# Patient Record
Sex: Female | Born: 1960 | Race: White | Hispanic: No | Smoking: Current every day smoker
Health system: Southern US, Community
[De-identification: ages and names within clinical notes are randomized; demographics above are authoritative.]

## PROBLEM LIST (undated history)

## (undated) DIAGNOSIS — N281 Cyst of kidney, acquired: Secondary | ICD-10-CM

## (undated) DIAGNOSIS — G8929 Other chronic pain: Secondary | ICD-10-CM

## (undated) DIAGNOSIS — M779 Enthesopathy, unspecified: Secondary | ICD-10-CM

## (undated) DIAGNOSIS — G589 Mononeuropathy, unspecified: Secondary | ICD-10-CM

## (undated) DIAGNOSIS — F32A Depression, unspecified: Secondary | ICD-10-CM

## (undated) DIAGNOSIS — R519 Headache, unspecified: Secondary | ICD-10-CM

## (undated) DIAGNOSIS — R7303 Prediabetes: Secondary | ICD-10-CM

## (undated) DIAGNOSIS — E611 Iron deficiency: Secondary | ICD-10-CM

## (undated) DIAGNOSIS — I639 Cerebral infarction, unspecified: Secondary | ICD-10-CM

## (undated) DIAGNOSIS — M5412 Radiculopathy, cervical region: Secondary | ICD-10-CM

## (undated) DIAGNOSIS — M549 Dorsalgia, unspecified: Secondary | ICD-10-CM

## (undated) DIAGNOSIS — M5020 Other cervical disc displacement, unspecified cervical region: Secondary | ICD-10-CM

## (undated) DIAGNOSIS — M1990 Unspecified osteoarthritis, unspecified site: Secondary | ICD-10-CM

## (undated) DIAGNOSIS — Z87442 Personal history of urinary calculi: Secondary | ICD-10-CM

## (undated) DIAGNOSIS — F329 Major depressive disorder, single episode, unspecified: Secondary | ICD-10-CM

## (undated) DIAGNOSIS — J189 Pneumonia, unspecified organism: Secondary | ICD-10-CM

## (undated) DIAGNOSIS — F4310 Post-traumatic stress disorder, unspecified: Secondary | ICD-10-CM

## (undated) DIAGNOSIS — R51 Headache: Secondary | ICD-10-CM

## (undated) DIAGNOSIS — M5416 Radiculopathy, lumbar region: Secondary | ICD-10-CM

## (undated) DIAGNOSIS — M542 Cervicalgia: Secondary | ICD-10-CM

## (undated) HISTORY — PX: COLONOSCOPY: SHX174

## (undated) HISTORY — PX: TUBAL LIGATION: SHX77

---

## 2009-07-11 ENCOUNTER — Emergency Department (HOSPITAL_COMMUNITY): Admission: EM | Admit: 2009-07-11 | Discharge: 2009-07-11 | Payer: Self-pay | Admitting: Emergency Medicine

## 2009-07-18 ENCOUNTER — Emergency Department (HOSPITAL_COMMUNITY): Admission: EM | Admit: 2009-07-18 | Discharge: 2009-07-18 | Payer: Self-pay | Admitting: Emergency Medicine

## 2009-09-07 ENCOUNTER — Emergency Department (HOSPITAL_COMMUNITY): Admission: EM | Admit: 2009-09-07 | Discharge: 2009-09-07 | Payer: Self-pay | Admitting: Emergency Medicine

## 2009-09-22 ENCOUNTER — Ambulatory Visit (HOSPITAL_COMMUNITY): Admission: RE | Admit: 2009-09-22 | Discharge: 2009-09-22 | Payer: Self-pay | Admitting: Family Medicine

## 2009-10-08 ENCOUNTER — Ambulatory Visit (HOSPITAL_COMMUNITY): Admission: RE | Admit: 2009-10-08 | Discharge: 2009-10-08 | Payer: Self-pay | Admitting: Family Medicine

## 2009-12-22 ENCOUNTER — Emergency Department (HOSPITAL_COMMUNITY): Admission: EM | Admit: 2009-12-22 | Discharge: 2009-12-22 | Payer: Self-pay | Admitting: Emergency Medicine

## 2010-05-13 ENCOUNTER — Ambulatory Visit (HOSPITAL_COMMUNITY)
Admission: RE | Admit: 2010-05-13 | Discharge: 2010-05-13 | Payer: Self-pay | Admitting: Physical Medicine and Rehabilitation

## 2010-10-14 ENCOUNTER — Ambulatory Visit (HOSPITAL_COMMUNITY)
Admission: RE | Admit: 2010-10-14 | Discharge: 2010-10-14 | Payer: Self-pay | Source: Home / Self Care | Attending: Physical Medicine and Rehabilitation | Admitting: Physical Medicine and Rehabilitation

## 2010-11-27 ENCOUNTER — Encounter: Payer: Self-pay | Admitting: Chiropractic Medicine

## 2012-08-25 ENCOUNTER — Encounter (HOSPITAL_COMMUNITY): Payer: Self-pay | Admitting: Emergency Medicine

## 2012-08-25 ENCOUNTER — Emergency Department (HOSPITAL_COMMUNITY)
Admission: EM | Admit: 2012-08-25 | Discharge: 2012-08-25 | Disposition: A | Discharge status: Home / Self Care | Payer: Medicaid Other | Attending: Emergency Medicine | Admitting: Emergency Medicine

## 2012-08-25 ENCOUNTER — Emergency Department (HOSPITAL_COMMUNITY): Payer: Medicaid Other

## 2012-08-25 DIAGNOSIS — M25579 Pain in unspecified ankle and joints of unspecified foot: Secondary | ICD-10-CM | POA: Insufficient documentation

## 2012-08-25 DIAGNOSIS — M549 Dorsalgia, unspecified: Secondary | ICD-10-CM | POA: Insufficient documentation

## 2012-08-25 DIAGNOSIS — M79609 Pain in unspecified limb: Secondary | ICD-10-CM | POA: Insufficient documentation

## 2012-08-25 DIAGNOSIS — M79671 Pain in right foot: Secondary | ICD-10-CM

## 2012-08-25 HISTORY — DX: Depression, unspecified: F32.A

## 2012-08-25 HISTORY — DX: Post-traumatic stress disorder, unspecified: F43.10

## 2012-08-25 HISTORY — DX: Enthesopathy, unspecified: M77.9

## 2012-08-25 HISTORY — DX: Major depressive disorder, single episode, unspecified: F32.9

## 2012-08-25 HISTORY — DX: Mononeuropathy, unspecified: G58.9

## 2012-08-25 MED ORDER — HYDROCODONE-ACETAMINOPHEN 5-325 MG PO TABS: ORAL_TABLET | ORAL | Status: DC

## 2012-08-25 MED ORDER — DEXAMETHASONE SODIUM PHOSPHATE 4 MG/ML IJ SOLN
8.0000 mg | Freq: Once | INTRAMUSCULAR | Status: AC
  Administered 2012-08-25: 8 mg via INTRAMUSCULAR
  Filled 2012-08-25: qty 2

## 2012-08-25 MED ORDER — DEXAMETHASONE 6 MG PO TABS: ORAL_TABLET | ORAL | Status: DC

## 2012-08-25 NOTE — ED Provider Notes (Signed)
Medical screening examination/treatment/procedure(s) were performed by non-physician practitioner and as supervising physician I was immediately available for consultation/collaboration.   Cortlyn Cannell, MD 08/25/12 1457 

## 2012-08-25 NOTE — ED Notes (Signed)
Patient ambulatory to x-ray.

## 2012-08-25 NOTE — ED Notes (Signed)
Patient c/o right foot pain x16 days that has progressively getting worse. Patient states "It started out feeling like a cramp on the top of my foot now it's going up my leg and to the side of my foot." Reports intermittent swelling. Patient reports rolling a buggy over foot 2 weeks prior to this, had a bruise a first but healed before cramping started.

## 2012-08-25 NOTE — ED Provider Notes (Signed)
History     CSN: [id number]  Arrival date & time 08/25/12  1041   First MD Initiated Contact with Patient 08/25/12 1102      Chief Complaint  Patient presents with  . Foot Pain    (Consider location/radiation/quality/duration/timing/severity/associated sxs/prior treatment) Patient is a 51 y.o. female presenting with lower extremity pain. The history is provided by the patient.  Foot Pain This is a new problem. The current episode started 1 to 4 weeks ago. The problem occurs constantly. The problem has been unchanged. Pertinent negatives include no abdominal pain, arthralgias, chest pain, chills, coughing, fever, joint swelling, neck pain or numbness. The symptoms are aggravated by standing and walking. She has tried NSAIDs (soaking in salt water.) for the symptoms. The treatment provided no relief.    Past Medical History  Diagnosis Date  . Bone spur   . Pinched nerve   . PTSD (post-traumatic stress disorder)   . Depression     Past Surgical History  Procedure Date  . Tubal ligation     Family History  Problem Relation Age of Onset  . Heart failure Mother   . Heart failure Father     History  Substance Use Topics  . Smoking status: Former Smoker -- 1.0 packs/day for 38 years    Types: Cigarettes    Quit date: 08/24/2012  . Smokeless tobacco: Never Used  . Alcohol Use: No    OB History    Grav Para Term Preterm Abortions TAB SAB Ect Mult Living   2 2 1 1      2       Review of Systems  Constitutional: Negative for fever, chills and activity change.       All ROS Neg except as noted in HPI  HENT: Negative for nosebleeds and neck pain.   Eyes: Negative for photophobia and discharge.  Respiratory: Negative for cough, shortness of breath and wheezing.   Cardiovascular: Negative for chest pain and palpitations.  Gastrointestinal: Negative for abdominal pain and blood in stool.  Genitourinary: Negative for dysuria, frequency and hematuria.  Musculoskeletal:  Positive for back pain. Negative for joint swelling and arthralgias.  Skin: Negative.   Neurological: Negative for dizziness, seizures, speech difficulty and numbness.  Psychiatric/Behavioral: Negative for hallucinations and confusion.    Allergies  Review of patient's allergies indicates no known allergies.  Home Medications   Current Outpatient Rx  Name Route Sig Dispense Refill  . CELECOXIB 200 MG PO CAPS Oral Take 200 mg by mouth 2 (two) times daily.    . CYCLOBENZAPRINE HCL 10 MG PO TABS Oral Take 10 mg by mouth at bedtime as needed. Muscle spasms.    Marland Kitchen DOXEPIN HCL 150 MG PO CAPS Oral Take 150 mg by mouth at bedtime.    Marland Kitchen SALONPAS EX Apply externally Apply 1 patch topically daily as needed. Foot pain/ minor pains.    Valetta Fuller HOT EX Apply externally Apply 1 application topically daily as needed. Foot pain.    Marland Kitchen PREGABALIN 75 MG PO CAPS Oral Take 75 mg by mouth daily.      BP 126/90  Pulse 99  Temp 98.3 F (36.8 C) (Oral)  Resp 18  Ht 5' (1.524 m)  Wt 135 lb (61.236 kg)  BMI 26.37 kg/m2  SpO2 98%  Physical Exam  Nursing note and vitals reviewed. Constitutional: She is oriented to person, place, and time. She appears well-developed and well-nourished.  Non-toxic appearance.  HENT:  Head: Normocephalic.  Right Ear: Tympanic  membrane and external ear normal.  Left Ear: Tympanic membrane and external ear normal.  Eyes: EOM and lids are normal. Pupils are equal, round, and reactive to light.  Neck: Normal range of motion. Neck supple. Carotid bruit is not present.  Cardiovascular: Normal rate, regular rhythm, normal heart sounds, intact distal pulses and normal pulses.   Pulmonary/Chest: Breath sounds normal. No respiratory distress.  Abdominal: Soft. Bowel sounds are normal. There is no tenderness. There is no guarding.  Musculoskeletal: Normal range of motion.       Pain to palpation dorsally at the MT joint at the 2nd,3rd, and 4th joint area. No lesions between toes. No  plantar puncture wounds. No temp changes.   Lymphadenopathy:       Head (right side): No submandibular adenopathy present.       Head (left side): No submandibular adenopathy present.    She has no cervical adenopathy.  Neurological: She is alert and oriented to person, place, and time. She has normal strength. No cranial nerve deficit or sensory deficit.  Skin: Skin is warm and dry.  Psychiatric: She has a normal mood and affect. Her speech is normal.    ED Course  Procedures (including critical care time)  Labs Reviewed - No data to display Dg Foot Complete Right  08/25/2012  *RADIOLOGY REPORT*  Clinical Data: Foot pain  RIGHT FOOT COMPLETE - 3+ VIEW  Comparison: None.  Findings: Three views of the right foot submitted.  No acute fracture or subluxation.  No radiopaque foreign body.  IMPRESSION: No acute fracture or subluxation.   Original Report Authenticated By: Natasha Mead, M.D.      No diagnosis found.    MDM  I have reviewed nursing notes, vital signs, and all appropriate lab and imaging results for this patient. Foot xray is negative for fx or dislocation. Question inflammatory process, or nerve irritation. Will try decadron and pain medication. Pt to see podiatry if not improving.       Kathie Dike, [place] 08/25/12 1309

## 2013-11-18 ENCOUNTER — Other Ambulatory Visit (HOSPITAL_COMMUNITY): Payer: Self-pay | Admitting: Internal Medicine

## 2013-11-18 DIAGNOSIS — Z139 Encounter for screening, unspecified: Secondary | ICD-10-CM

## 2013-11-21 ENCOUNTER — Ambulatory Visit (HOSPITAL_COMMUNITY)
Admission: RE | Admit: 2013-11-21 | Discharge: 2013-11-21 | Disposition: A | Discharge status: Home / Self Care | Payer: Medicare Other | Source: Ambulatory Visit | Attending: Internal Medicine | Admitting: Internal Medicine

## 2013-11-21 DIAGNOSIS — Z139 Encounter for screening, unspecified: Secondary | ICD-10-CM

## 2013-11-21 DIAGNOSIS — Z1231 Encounter for screening mammogram for malignant neoplasm of breast: Secondary | ICD-10-CM | POA: Insufficient documentation

## 2014-09-08 ENCOUNTER — Encounter (HOSPITAL_COMMUNITY): Payer: Self-pay | Admitting: Emergency Medicine

## 2014-12-16 ENCOUNTER — Other Ambulatory Visit (HOSPITAL_COMMUNITY): Payer: Self-pay | Admitting: Internal Medicine

## 2014-12-16 DIAGNOSIS — Z1231 Encounter for screening mammogram for malignant neoplasm of breast: Secondary | ICD-10-CM

## 2014-12-16 DIAGNOSIS — Z1239 Encounter for other screening for malignant neoplasm of breast: Secondary | ICD-10-CM

## 2014-12-25 ENCOUNTER — Ambulatory Visit (HOSPITAL_COMMUNITY)
Admission: RE | Admit: 2014-12-25 | Discharge: 2014-12-25 | Disposition: A | Discharge status: Home / Self Care | Payer: Medicare Other | Source: Ambulatory Visit | Attending: Internal Medicine | Admitting: Internal Medicine

## 2014-12-25 ENCOUNTER — Other Ambulatory Visit (HOSPITAL_COMMUNITY): Payer: Self-pay | Admitting: Internal Medicine

## 2014-12-25 DIAGNOSIS — R071 Chest pain on breathing: Principal | ICD-10-CM

## 2014-12-25 DIAGNOSIS — W19XXXA Unspecified fall, initial encounter: Secondary | ICD-10-CM | POA: Insufficient documentation

## 2014-12-25 DIAGNOSIS — R0789 Other chest pain: Secondary | ICD-10-CM

## 2014-12-25 DIAGNOSIS — T1490 Injury, unspecified: Secondary | ICD-10-CM | POA: Insufficient documentation

## 2014-12-25 DIAGNOSIS — R079 Chest pain, unspecified: Secondary | ICD-10-CM | POA: Insufficient documentation

## 2014-12-25 DIAGNOSIS — Z1231 Encounter for screening mammogram for malignant neoplasm of breast: Secondary | ICD-10-CM | POA: Diagnosis not present

## 2014-12-25 DIAGNOSIS — Z720 Tobacco use: Secondary | ICD-10-CM | POA: Diagnosis not present

## 2015-01-17 ENCOUNTER — Emergency Department (HOSPITAL_COMMUNITY)
Admission: EM | Admit: 2015-01-17 | Discharge: 2015-01-17 | Disposition: A | Discharge status: Home / Self Care | Payer: Medicare Other | Attending: Emergency Medicine | Admitting: Emergency Medicine

## 2015-01-17 ENCOUNTER — Encounter (HOSPITAL_COMMUNITY): Payer: Self-pay | Admitting: *Deleted

## 2015-01-17 DIAGNOSIS — Z8739 Personal history of other diseases of the musculoskeletal system and connective tissue: Secondary | ICD-10-CM | POA: Insufficient documentation

## 2015-01-17 DIAGNOSIS — X58XXXA Exposure to other specified factors, initial encounter: Secondary | ICD-10-CM | POA: Insufficient documentation

## 2015-01-17 DIAGNOSIS — Z791 Long term (current) use of non-steroidal anti-inflammatories (NSAID): Secondary | ICD-10-CM | POA: Diagnosis not present

## 2015-01-17 DIAGNOSIS — Y9289 Other specified places as the place of occurrence of the external cause: Secondary | ICD-10-CM | POA: Diagnosis not present

## 2015-01-17 DIAGNOSIS — Z8669 Personal history of other diseases of the nervous system and sense organs: Secondary | ICD-10-CM | POA: Insufficient documentation

## 2015-01-17 DIAGNOSIS — S161XXA Strain of muscle, fascia and tendon at neck level, initial encounter: Secondary | ICD-10-CM | POA: Insufficient documentation

## 2015-01-17 DIAGNOSIS — S3992XA Unspecified injury of lower back, initial encounter: Secondary | ICD-10-CM | POA: Diagnosis present

## 2015-01-17 DIAGNOSIS — Z87891 Personal history of nicotine dependence: Secondary | ICD-10-CM | POA: Diagnosis not present

## 2015-01-17 DIAGNOSIS — Y9389 Activity, other specified: Secondary | ICD-10-CM | POA: Insufficient documentation

## 2015-01-17 DIAGNOSIS — F329 Major depressive disorder, single episode, unspecified: Secondary | ICD-10-CM | POA: Insufficient documentation

## 2015-01-17 DIAGNOSIS — Z79899 Other long term (current) drug therapy: Secondary | ICD-10-CM | POA: Diagnosis not present

## 2015-01-17 DIAGNOSIS — Y998 Other external cause status: Secondary | ICD-10-CM | POA: Insufficient documentation

## 2015-01-17 HISTORY — DX: Other cervical disc displacement, unspecified cervical region: M50.20

## 2015-01-17 MED ORDER — HYDROCODONE-ACETAMINOPHEN 7.5-325 MG PO TABS: 1.0000 | ORAL_TABLET | Freq: Four times a day (QID) | ORAL | Status: DC | PRN

## 2015-01-17 NOTE — ED Notes (Signed)
Pt states she woke up with mid upper back pain, hx of same, states numbness to left hand

## 2015-01-17 NOTE — Discharge Instructions (Signed)

## 2015-01-18 NOTE — ED Provider Notes (Signed)
CSN: [id number]     Arrival date & time 01/17/15  1129 History   First MD Initiated Contact with Patient 01/17/15 1208     Chief Complaint  Patient presents with  . Back Pain     (Consider location/radiation/quality/duration/timing/severity/associated sxs/prior Treatment) HPI   Kristen [name] is a 54 y.o. female who presents to the Emergency Department complaining of left upper back and left neck pain that has been increasing for 2-3 days.  She reports intermittent tingling and numbness to the left arm and hand, but not at present.  She also states this has been a long term problem, and usually relieved with topical analgesics, lyrica and Zanaflex, but those medications have not relieved her pain this time.  She associates her upper back and neck pain to being diagnosed with bone spurs and a "pinched nerve" in her neck.  She denies recent injury, fever, chills, headaches, visual changes or spinal tenderness.     Past Medical History  Diagnosis Date  . Bone spur   . Pinched nerve   . PTSD (post-traumatic stress disorder)   . Depression   . Herniated disc, cervical    Past Surgical History  Procedure Laterality Date  . Tubal ligation     Family History  Problem Relation Age of Onset  . Heart failure Mother   . Heart failure Father    History  Substance Use Topics  . Smoking status: Former Smoker -- 1.00 packs/day for 38 years    Types: Cigarettes    Quit date: 08/24/2012  . Smokeless tobacco: Never Used  . Alcohol Use: No   OB History    Gravida Para Term Preterm AB TAB SAB Ectopic Multiple Living   Review of Systems  Constitutional: Negative for fever.  Respiratory: Negative for shortness of breath.   Gastrointestinal: Negative for vomiting, abdominal pain and constipation.  Genitourinary: Negative for dysuria, hematuria, flank pain, decreased urine volume and difficulty urinating.  Musculoskeletal: Positive for back pain and neck pain. Negative  for joint swelling and neck stiffness.  Skin: Negative for rash.  Neurological: Positive for numbness (intermittent numbness and tingling to left arm). Negative for dizziness, syncope, facial asymmetry, speech difficulty, weakness and headaches.  Psychiatric/Behavioral: Negative for confusion.  All other systems reviewed and are negative.     Allergies  Ultram  Home Medications   Prior to Admission medications   Medication Sig Start Date End Date Taking? Authorizing Provider  celecoxib (CELEBREX) 200 MG capsule Take 200 mg by mouth daily.    Yes Historical Provider, MD  cetirizine (ZYRTEC) 10 MG tablet Take 10 mg by mouth daily.   Yes Historical Provider, MD  doxepin (SINEQUAN) 150 MG capsule Take 150 mg by mouth at bedtime.   Yes Historical Provider, MD  Liniments (SALONPAS EX) Apply 1 patch topically daily as needed. Foot pain/ minor pains.   Yes Historical Provider, MD  Menthol, Topical Analgesic, (ICY HOT EX) Apply 1 application topically daily as needed. Foot pain.   Yes Historical Provider, MD  pregabalin (LYRICA) 75 MG capsule Take 75 mg by mouth daily.   Yes Historical Provider, MD  tiZANidine (ZANAFLEX) 4 MG tablet Take 4 mg by mouth 2 (two) times daily as needed for muscle spasms.   Yes Historical Provider, MD  HYDROcodone-acetaminophen (NORCO) 7.5-325 MG per tablet Take 1 tablet by mouth every 6 (six) hours as needed for moderate pain. 01/17/15  Tammi Jovoni Borkenhagen, PA-C   BP 139/83 mmHg  Pulse 77  Temp(Src) 98.1 F (36.7 C) (Oral)  Resp 18  Ht 5' (1.524 m)  Wt 157 lb (71.215 kg)  BMI 30.66 kg/m2  SpO2 100% Physical Exam  Constitutional: She is oriented to person, place, and time. She appears well-developed and well-nourished. No distress.  HENT:  Head: Normocephalic and atraumatic.  Eyes: Conjunctivae and EOM are normal.  Neck: Phonation normal. Neck supple. No JVD present. Spinous process tenderness and muscular tenderness present. Normal range of motion present. No  Kernig's sign noted. No thyromegaly present.    Cardiovascular: Normal rate, regular rhythm, normal heart sounds and intact distal pulses.   No murmur heard. Pulmonary/Chest: Effort normal and breath sounds normal. No respiratory distress.  Musculoskeletal: She exhibits tenderness. She exhibits no edema.  See neck exam  Lymphadenopathy:    She has no cervical adenopathy.  Neurological: She is alert and oriented to person, place, and time. She exhibits normal muscle tone. Coordination normal.  Skin: Skin is warm and dry.  Nursing note and vitals reviewed.   ED Course  Procedures (including critical care time) Labs Review Labs Reviewed - No data to display  Imaging Review No results found.   EKG Interpretation None      MDM   Final diagnoses:  Strain of neck muscle, initial encounter   Pt had MRI of C spine in 2010 that showed left foraminal narrowing at C6-7 due to disc bulging and uncinate spurring with mild left foraminal narrowing at C4-5 and C5-6 due to uncinate Spurring and fairly severe right foraminal stenosis at C4-5.   Pt is well appearing, NV intact.  No concerning sx's for emergent neurological or infectious process.  Likely acute on chronic pain.  Patient agrees to symptomatic tx and close f/u with her PMD.  Appears stable for d/c and rx for hydrocodone for pain, will cont her current medications    Severiano Gilbertammi Shaughnessy Gethers, PA-C 01/18/15 1706  Donnetta HutchingBrian Cook, MD 01/22/15 1539

## 2016-02-21 ENCOUNTER — Emergency Department (HOSPITAL_COMMUNITY): Payer: Medicare Other

## 2016-02-21 ENCOUNTER — Emergency Department (HOSPITAL_COMMUNITY)
Admission: EM | Admit: 2016-02-21 | Discharge: 2016-02-21 | Disposition: A | Discharge status: Home / Self Care | Payer: Medicare Other | Attending: Emergency Medicine | Admitting: Emergency Medicine

## 2016-02-21 ENCOUNTER — Encounter (HOSPITAL_COMMUNITY): Payer: Self-pay | Admitting: Emergency Medicine

## 2016-02-21 DIAGNOSIS — Z7982 Long term (current) use of aspirin: Secondary | ICD-10-CM | POA: Insufficient documentation

## 2016-02-21 DIAGNOSIS — Z87891 Personal history of nicotine dependence: Secondary | ICD-10-CM | POA: Diagnosis not present

## 2016-02-21 DIAGNOSIS — F329 Major depressive disorder, single episode, unspecified: Secondary | ICD-10-CM | POA: Insufficient documentation

## 2016-02-21 DIAGNOSIS — K5900 Constipation, unspecified: Secondary | ICD-10-CM | POA: Diagnosis not present

## 2016-02-21 HISTORY — DX: Radiculopathy, cervical region: M54.12

## 2016-02-21 HISTORY — DX: Cervicalgia: M54.2

## 2016-02-21 HISTORY — DX: Dorsalgia, unspecified: M54.9

## 2016-02-21 HISTORY — DX: Other chronic pain: G89.29

## 2016-02-21 HISTORY — DX: Radiculopathy, lumbar region: M54.16

## 2016-02-21 LAB — CBC WITH DIFFERENTIAL/PLATELET
Basophils Absolute: 0 10*3/uL (ref 0.0–0.1)
Basophils Relative: 0 %
EOS ABS: 0 10*3/uL (ref 0.0–0.7)
Eosinophils Relative: 0 %
HCT: 44.3 % (ref 36.0–46.0)
HEMOGLOBIN: 15 g/dL (ref 12.0–15.0)
LYMPHS ABS: 2.9 10*3/uL (ref 0.7–4.0)
Lymphocytes Relative: 23 %
MCH: 31.9 pg (ref 26.0–34.0)
MCHC: 33.9 g/dL (ref 30.0–36.0)
MCV: 94.3 fL (ref 78.0–100.0)
MONO ABS: 0.3 10*3/uL (ref 0.1–1.0)
MONOS PCT: 2 %
NEUTROS PCT: 75 %
Neutro Abs: 9.5 10*3/uL — ABNORMAL HIGH (ref 1.7–7.7)
Platelets: 450 10*3/uL — ABNORMAL HIGH (ref 150–400)
RBC: 4.7 MIL/uL (ref 3.87–5.11)
RDW: 13.9 % (ref 11.5–15.5)
WBC: 12.8 10*3/uL — ABNORMAL HIGH (ref 4.0–10.5)

## 2016-02-21 LAB — COMPREHENSIVE METABOLIC PANEL
ALK PHOS: 99 U/L (ref 38–126)
ALT: 17 U/L (ref 14–54)
ANION GAP: 9 (ref 5–15)
AST: 24 U/L (ref 15–41)
Albumin: 5.1 g/dL — ABNORMAL HIGH (ref 3.5–5.0)
BILIRUBIN TOTAL: 0.6 mg/dL (ref 0.3–1.2)
BUN: 8 mg/dL (ref 6–20)
CALCIUM: 9.6 mg/dL (ref 8.9–10.3)
CO2: 26 mmol/L (ref 22–32)
Chloride: 106 mmol/L (ref 101–111)
Creatinine, Ser: 0.8 mg/dL (ref 0.44–1.00)
GFR calc non Af Amer: >60 mL/min (ref >60)
Glucose, Bld: 121 mg/dL — ABNORMAL HIGH (ref 65–99)
Potassium: 3.7 mmol/L (ref 3.5–5.1)
SODIUM: 141 mmol/L (ref 135–145)
TOTAL PROTEIN: 8.6 g/dL — AB (ref 6.5–8.1)

## 2016-02-21 MED ORDER — SODIUM CHLORIDE 0.9 % IV BOLUS (SEPSIS)
1000.0000 mL | Freq: Once | INTRAVENOUS | Status: AC
  Administered 2016-02-21: 1000 mL via INTRAVENOUS

## 2016-02-21 MED ORDER — POLYETHYLENE GLYCOL 3350 17 G PO PACK
17.0000 g | PACK | Freq: Once | ORAL | Status: AC
  Administered 2016-02-21: 17 g via ORAL
  Filled 2016-02-21: qty 1

## 2016-02-21 MED ORDER — POLYETHYLENE GLYCOL 3350 17 G PO PACK: 17.0000 g | PACK | Freq: Every day | ORAL | Status: DC

## 2016-02-21 MED ORDER — MAGNESIUM HYDROXIDE 400 MG/5ML PO SUSP
960.0000 mL | Freq: Once | ORAL | Status: AC
Start: 2016-02-21 — End: 2016-02-21
  Administered 2016-02-21: 960 mL via RECTAL
  Filled 2016-02-21: qty 240

## 2016-02-21 MED ORDER — DIATRIZOATE MEGLUMINE & SODIUM 66-10 % PO SOLN
ORAL | Status: AC
  Administered 2016-02-21: 30 mL
  Filled 2016-02-21: qty 30

## 2016-02-21 MED ORDER — IOPAMIDOL (ISOVUE-300) INJECTION 61%
100.0000 mL | Freq: Once | INTRAVENOUS | Status: AC | PRN
  Administered 2016-02-21: 100 mL via INTRAVENOUS

## 2016-02-21 NOTE — ED Notes (Signed)
Pt presents to ED with constipation since Tuesday.  Pt states she is having lower back pain.  No N/V.

## 2016-02-21 NOTE — ED Provider Notes (Signed)
CSN: [id number]     Arrival date & time 02/21/16  1204 History   First MD Initiated Contact with Patient 02/21/16 1226     Chief Complaint  Patient presents with  . Constipation     (Consider location/radiation/quality/duration/timing/severity/associated sxs/prior Treatment) Patient is a 55 y.o. female presenting with constipation.  Constipation Severity:  Moderate Time since last bowel movement:  5 days Timing:  Constant Progression:  Worsening Chronicity:  New Context: not dehydration, not dietary changes, not medication, not narcotics and not stress   Stool description:  Small Ineffective treatments:  Activity, laxatives, voiding and stool softeners Associated symptoms: urinary retention   Associated symptoms: no abdominal pain, no anorexia, no dysuria, no fever and no nausea   Risk factors: no change in medication and no recent surgery     Past Medical History  Diagnosis Date  . Bone spur   . Pinched nerve   . PTSD (post-traumatic stress disorder)   . Depression   . Herniated disc, cervical   . Chronic neck and back pain   . Cervical radiculopathy   . Lumbar radiculopathy    Past Surgical History  Procedure Laterality Date  . Tubal ligation     Family History  Problem Relation Age of Onset  . Heart failure Mother   . Heart failure Father    Social History  Substance Use Topics  . Smoking status: Former Smoker -- 1.00 packs/day for 38 years    Types: Cigarettes    Quit date: 08/24/2012  . Smokeless tobacco: Never Used  . Alcohol Use: No   OB History    Gravida Para Term Preterm AB TAB SAB Ectopic Multiple Living   Review of Systems  Constitutional: Negative for fever.  Eyes: Negative for pain.  Respiratory: Negative for cough and shortness of breath.   Gastrointestinal: Positive for constipation. Negative for nausea, abdominal pain and anorexia.  Endocrine: Negative for polydipsia and polyuria.  Genitourinary: Negative for dysuria.   All other systems reviewed and are negative.     Allergies  Ultram  Home Medications   Prior to Admission medications   Medication Sig Start Date End Date Taking? Authorizing Provider  aspirin EC 81 MG tablet Take 81 mg by mouth every morning.   Yes Historical Provider, MD  celecoxib (CELEBREX) 200 MG capsule Take 200 mg by mouth daily.    Yes Historical Provider, MD  cetirizine (ZYRTEC) 10 MG tablet Take 10 mg by mouth daily.   Yes Historical Provider, MD  doxepin (SINEQUAN) 150 MG capsule Take 150 mg by mouth at bedtime.  total at bedtime   Yes Historical Provider, MD  doxepin (SINEQUAN) 50 MG capsule Take 50 mg by mouth at bedtime.  total at bedtime 02/04/16  Yes Historical Provider, MD  pregabalin (LYRICA) 75 MG capsule Take 75 mg by mouth daily.   Yes Historical Provider, MD  simvastatin (ZOCOR) 20 MG tablet Take 20 mg by mouth every evening.  01/06/16  Yes Historical Provider, MD  tiZANidine (ZANAFLEX) 4 MG tablet Take 4 mg by mouth 2 (two) times daily as needed for muscle spasms.   Yes Historical Provider, MD  polyethylene glycol (MIRALAX / GLYCOLAX) packet Take 17 g by mouth daily. 02/21/16   Marily Memos, MD   BP 127/78 mmHg  Pulse 73  Temp(Src) 98.3 F (36.8 C) (Oral)  Resp 16  Ht  (1.549 m)  Wt 159 lb (72.122  kg)  BMI 30.06 kg/m2  SpO2 100% Physical Exam  Constitutional: She appears well-developed and well-nourished.  HENT:  Head: Normocephalic and atraumatic.  Neck: Normal range of motion.  Cardiovascular: Normal rate and regular rhythm.   Pulmonary/Chest: Effort normal. No stridor. No respiratory distress. She has no wheezes.  Abdominal: Soft. She exhibits no distension. There is no tenderness. There is no rebound.  Musculoskeletal: Normal range of motion. She exhibits no edema or tenderness.  Neurological: She is alert.  Nursing note and vitals reviewed.   ED Course  Procedures (including critical care time) Labs Review Labs Reviewed  CBC  WITH DIFFERENTIAL/PLATELET - Abnormal; Notable for the following:    WBC 12.8 (*)    Platelets 450 (*)    Neutro Abs 9.5 (*)    All other components within normal limits  COMPREHENSIVE METABOLIC PANEL - Abnormal; Notable for the following:    Glucose, Bld 121 (*)    Total Protein 8.6 (*)    Albumin 5.1 (*)    All other components within normal limits    Imaging Review Ct Abdomen Pelvis W Contrast  02/21/2016  CLINICAL DATA:  Patient with constipation and low back pain. EXAM: CT ABDOMEN AND PELVIS WITH CONTRAST TECHNIQUE: Multidetector CT imaging of the abdomen and pelvis was performed using the standard protocol following bolus administration of intravenous contrast. CONTRAST:  100mL ISOVUE-300 IOPAMIDOL (ISOVUE-300) INJECTION 61% COMPARISON:  None. FINDINGS: Lower chest: Normal heart size. No large area of pulmonary consolidation. Dependent atelectasis right lower lobe. Hepatobiliary: Liver is normal in size and contour. No focal lesion is identified. 11 mm soft tissue mass near the fundus of the gallbladder (image 30; series 2). No intrahepatic or extrahepatic biliary ductal dilatation. Pancreas: Unremarkable Spleen: Unremarkable Adrenals/Urinary Tract: Adrenal glands are normal. There is a 1.3 cm nonobstructing stone within the inferior pole the right kidney. Left-greater-than-right parapelvic cyst. Multiple too small to characterize low-attenuation lesions are demonstrated within the superior pole of the left kidney. No hydroureteronephrosis. Urinary bladder is unremarkable. Stomach/Bowel: Large amount of stool within the rectum. No evidence for bowel obstruction. No free fluid or free intraperitoneal air. Vascular/Lymphatic: Normal caliber abdominal aorta. Peripheral calcified atherosclerotic plaque. No retroperitoneal lymphadenopathy. Other: Sub serosal fibroid off the uterine fundus. Normal adnexal structures. Musculoskeletal: Lumbar spine degenerative changes. No aggressive or acute appearing  osseous lesions. IMPRESSION: Large amount of stool within the rectum concerning for rectal impaction. 11 mm soft tissue density mass within the gallbladder fundus. Recommend dedicated evaluation with ultrasound in the non-acute setting. Nonobstructing stone inferior pole right kidney. Left-greater-than-right parapelvic cysts. Fibroid uterus. Electronically Signed   By: Annia Beltrew  Davis M.D.   On: 02/21/2016 16:08   Dg Abd Acute W/chest  02/21/2016  CLINICAL DATA:  Abdomen and back pain.  Constipation. EXAM: DG ABDOMEN ACUTE W/ 1V CHEST COMPARISON:  The chest dated 12/25/2014. Lumbar spine dated 05/13/2010. FINDINGS: Borderline enlarged cardiac silhouette. Clear lungs. Minimally prominent interstitial markings. Normal bowel gas pattern without free peritoneal air. The previously demonstrated 1 cm right mid to lower abdominal calcification currently measures 1.2 cm. Mild lower lumbar spine degenerative changes. IMPRESSION: 1. No acute abnormality. 2. Stable minimal chronic interstitial lung disease. 3. Mild increase in size of the previously demonstrated right abdominal calcification. Electronically Signed   By: Beckie SaltsSteven  Reid M.D.   On: 02/21/2016 13:33   I have personally reviewed and evaluated these images and lab results as part of my medical decision-making.   EKG Interpretation None      MDM  Final diagnoses:  Constipation, unspecified constipation type    Workup negative, however will need renal US to eval for soft tissue abnormality. Had BM which improved her abdominal and back pain. Will take miralax daily for a couple weeks then work on weaning off of it.   New Prescriptions: Discharge Medication List as of 02/21/2016  5:59 PM    START taking these medications   Details  polyethylene glycol (MIRALAX / GLYCOLAX) packet Take 17 g by mouth daily., Starting 02/21/2016, Until Discontinued, Print         I have personally and contemperaneously reviewed labs and imaging and used in my  decision making as above.   A medical screening exam was performed and I feel the patient has had an appropriate workup for their chief complaint at this time and likelihood of emergent condition existing is low. Their vital signs are stable. They have been counseled on decision, discharge, follow up and which symptoms necessitate immediate return to the emergency department.  They verbally stated understanding and agreement with plan and discharged in stable condition.      Marily Memos, MD 02/21/16 2226

## 2016-03-07 ENCOUNTER — Other Ambulatory Visit (HOSPITAL_COMMUNITY): Payer: Self-pay | Admitting: Internal Medicine

## 2016-03-07 DIAGNOSIS — Z1231 Encounter for screening mammogram for malignant neoplasm of breast: Secondary | ICD-10-CM

## 2016-03-11 ENCOUNTER — Ambulatory Visit (HOSPITAL_COMMUNITY)
Admission: RE | Admit: 2016-03-11 | Discharge: 2016-03-11 | Disposition: A | Discharge status: Home / Self Care | Payer: Medicare Other | Source: Ambulatory Visit | Attending: Internal Medicine | Admitting: Internal Medicine

## 2016-03-11 DIAGNOSIS — Z1231 Encounter for screening mammogram for malignant neoplasm of breast: Secondary | ICD-10-CM | POA: Insufficient documentation

## 2016-07-25 ENCOUNTER — Other Ambulatory Visit (HOSPITAL_COMMUNITY): Payer: Self-pay | Admitting: Internal Medicine

## 2016-07-25 DIAGNOSIS — R109 Unspecified abdominal pain: Secondary | ICD-10-CM

## 2016-07-28 ENCOUNTER — Ambulatory Visit (HOSPITAL_COMMUNITY): Admission: RE | Admit: 2016-07-28 | Payer: Medicare Other | Source: Ambulatory Visit

## 2016-07-29 ENCOUNTER — Ambulatory Visit (HOSPITAL_COMMUNITY)
Admission: RE | Admit: 2016-07-29 | Discharge: 2016-07-29 | Disposition: A | Discharge status: Home / Self Care | Payer: Medicare Other | Source: Ambulatory Visit | Attending: Internal Medicine | Admitting: Internal Medicine

## 2016-07-29 DIAGNOSIS — R109 Unspecified abdominal pain: Secondary | ICD-10-CM

## 2016-07-29 DIAGNOSIS — N200 Calculus of kidney: Secondary | ICD-10-CM | POA: Diagnosis not present

## 2016-07-29 DIAGNOSIS — R932 Abnormal findings on diagnostic imaging of liver and biliary tract: Secondary | ICD-10-CM | POA: Insufficient documentation

## 2016-07-29 DIAGNOSIS — R935 Abnormal findings on diagnostic imaging of other abdominal regions, including retroperitoneum: Secondary | ICD-10-CM | POA: Diagnosis not present

## 2016-07-29 DIAGNOSIS — N281 Cyst of kidney, acquired: Secondary | ICD-10-CM | POA: Diagnosis not present

## 2016-08-01 ENCOUNTER — Other Ambulatory Visit: Payer: Self-pay | Admitting: Internal Medicine

## 2016-08-01 DIAGNOSIS — E2839 Other primary ovarian failure: Secondary | ICD-10-CM

## 2016-08-10 ENCOUNTER — Ambulatory Visit
Admission: RE | Admit: 2016-08-10 | Discharge: 2016-08-10 | Disposition: A | Discharge status: Home / Self Care | Payer: Medicare Other | Source: Ambulatory Visit | Attending: Internal Medicine | Admitting: Internal Medicine

## 2016-08-10 DIAGNOSIS — E2839 Other primary ovarian failure: Secondary | ICD-10-CM

## 2016-11-08 ENCOUNTER — Ambulatory Visit (INDEPENDENT_AMBULATORY_CARE_PROVIDER_SITE_OTHER): Payer: Medicare Other | Admitting: Urology

## 2016-11-08 DIAGNOSIS — N281 Cyst of kidney, acquired: Secondary | ICD-10-CM

## 2016-11-08 DIAGNOSIS — N200 Calculus of kidney: Secondary | ICD-10-CM | POA: Diagnosis not present

## 2017-02-14 ENCOUNTER — Other Ambulatory Visit (HOSPITAL_COMMUNITY): Payer: Self-pay | Admitting: Internal Medicine

## 2017-02-14 DIAGNOSIS — Z1231 Encounter for screening mammogram for malignant neoplasm of breast: Secondary | ICD-10-CM

## 2017-03-16 ENCOUNTER — Ambulatory Visit (HOSPITAL_COMMUNITY)
Admission: RE | Admit: 2017-03-16 | Discharge: 2017-03-16 | Disposition: A | Discharge status: Home / Self Care | Payer: Medicare Other | Source: Ambulatory Visit | Attending: Internal Medicine | Admitting: Internal Medicine

## 2017-03-16 DIAGNOSIS — Z1231 Encounter for screening mammogram for malignant neoplasm of breast: Secondary | ICD-10-CM | POA: Diagnosis present

## 2017-05-09 ENCOUNTER — Ambulatory Visit (HOSPITAL_COMMUNITY)
Admission: RE | Admit: 2017-05-09 | Discharge: 2017-05-09 | Disposition: A | Discharge status: Home / Self Care | Payer: Medicare Other | Source: Ambulatory Visit | Attending: Urology | Admitting: Urology

## 2017-05-09 ENCOUNTER — Other Ambulatory Visit: Payer: Self-pay | Admitting: Urology

## 2017-05-09 ENCOUNTER — Ambulatory Visit (INDEPENDENT_AMBULATORY_CARE_PROVIDER_SITE_OTHER): Payer: Medicare Other | Admitting: Urology

## 2017-05-09 DIAGNOSIS — N200 Calculus of kidney: Secondary | ICD-10-CM | POA: Diagnosis present

## 2018-01-09 ENCOUNTER — Other Ambulatory Visit: Payer: Self-pay | Admitting: Internal Medicine

## 2018-01-09 DIAGNOSIS — E2839 Other primary ovarian failure: Secondary | ICD-10-CM

## 2018-02-23 ENCOUNTER — Other Ambulatory Visit (HOSPITAL_COMMUNITY): Payer: Self-pay | Admitting: Internal Medicine

## 2018-02-23 DIAGNOSIS — Z1231 Encounter for screening mammogram for malignant neoplasm of breast: Secondary | ICD-10-CM

## 2018-03-19 ENCOUNTER — Ambulatory Visit (HOSPITAL_COMMUNITY)
Admission: RE | Admit: 2018-03-19 | Discharge: 2018-03-19 | Disposition: A | Discharge status: Home / Self Care | Payer: Medicare Other | Source: Ambulatory Visit | Attending: Internal Medicine | Admitting: Internal Medicine

## 2018-03-19 DIAGNOSIS — Z1231 Encounter for screening mammogram for malignant neoplasm of breast: Secondary | ICD-10-CM | POA: Insufficient documentation

## 2018-04-11 ENCOUNTER — Encounter (HOSPITAL_COMMUNITY): Payer: Self-pay | Admitting: Emergency Medicine

## 2018-04-11 ENCOUNTER — Emergency Department (HOSPITAL_COMMUNITY)
Admission: EM | Admit: 2018-04-11 | Discharge: 2018-04-11 | Disposition: A | Discharge status: Home / Self Care | Payer: Medicare Other | Attending: Emergency Medicine | Admitting: Emergency Medicine

## 2018-04-11 ENCOUNTER — Other Ambulatory Visit: Payer: Self-pay

## 2018-04-11 DIAGNOSIS — G8929 Other chronic pain: Secondary | ICD-10-CM | POA: Insufficient documentation

## 2018-04-11 DIAGNOSIS — Z7982 Long term (current) use of aspirin: Secondary | ICD-10-CM | POA: Diagnosis not present

## 2018-04-11 DIAGNOSIS — M542 Cervicalgia: Secondary | ICD-10-CM | POA: Insufficient documentation

## 2018-04-11 DIAGNOSIS — Z79899 Other long term (current) drug therapy: Secondary | ICD-10-CM | POA: Diagnosis not present

## 2018-04-11 DIAGNOSIS — F17210 Nicotine dependence, cigarettes, uncomplicated: Secondary | ICD-10-CM | POA: Diagnosis not present

## 2018-04-11 MED ORDER — METHYLPREDNISOLONE 4 MG PO TBPK: ORAL_TABLET | ORAL | 0 refills | Status: DC

## 2018-04-11 NOTE — ED Triage Notes (Signed)
Pt here for her chronic neck and back pain. States this has been an on-going problem for 24 years. Denies recent injury/fall.

## 2018-04-11 NOTE — Discharge Instructions (Signed)
I suspect you are having nerve inflammation and pain.   Take steroid pack until completed.   Take [id number] mg acetaminophen (excedrin, tylenol) every 6 hours for pain.   Follow up with primary care doctor in 1 week for re-evaluation and possible referral for surgery.   Return to ER for  You have weakness or numbness in your hand, arm, face, or leg. You have a high fever. You have a stiff, rigid neck. You lose control of your bowels or your bladder (have incontinence). You have trouble with walking, balance, or speaking.

## 2018-04-11 NOTE — ED Provider Notes (Signed)
[facility] EMERGENCY DEPARTMENT Provider Note   CSN: [id number] Arrival date & time: 04/11/18  1122     History   Chief Complaint Chief Complaint  Patient presents with  . Neck Pain  . Back Pain    HPI Craig Staggersamara R Nanninga is a 57 y.o. female with history of chronic neck pain, known cervical radiculopathy and herniated disks is here for evaluation of gradually worsening neck pain.  Onset 1 month ago, acutely worsening and refractory to her pain medications for the last 5 days.  Last night she was unable to sleep due to pain.  Pain is moderate to severe.  Pain is described as a sharp, shooting pain from the base of her skull radiating to trapezius and lateral shoulders bilaterally, left greater than right.  She takes Celebrex, muscle relaxer, Lyrica and tizanidine on a regular basis which has not been helping.  She has began use Salonpas patches without relief.  Aggravating factors include neck movement and direct palpation of the neck and shoulder muscles.  Alleviated with massage.  She denies new trauma, falls.  She denies fevers, chills, neck rigidity, rash, one-sided tingling numbness or weakness, headache, vision changes.   HPI  Past Medical History:  Diagnosis Date  . Bone spur   . Cervical radiculopathy   . Chronic neck and back pain   . Depression   . Herniated disc, cervical   . Lumbar radiculopathy   . Pinched nerve   . PTSD (post-traumatic stress disorder)     There are no active problems to display for this patient.   Past Surgical History:  Procedure Laterality Date  . TUBAL LIGATION       OB History    Gravida  2   Para  2   Term  1   Preterm  1   AB      Living  2     SAB      TAB      Ectopic      Multiple      Live Births               Home Medications    Prior to Admission medications   Medication Sig Start Date End Date Taking? Authorizing Provider  amitriptyline (ELAVIL) 25 MG tablet Take 50 mg by mouth at bedtime. 04/04/18    [provider]  aspirin EC 81 MG tablet Take 81 mg by mouth every morning.    [provider]  celecoxib (CELEBREX) 200 MG capsule Take 200 mg by mouth daily.     [provider]  cetirizine (ZYRTEC) 10 MG tablet Take 10 mg by mouth daily.    [provider]  doxepin (SINEQUAN) 150 MG capsule Take 150 mg by mouth at bedtime. 200mg  total at bedtime    [provider]  doxepin (SINEQUAN) 50 MG capsule Take 50 mg by mouth at bedtime. 200mg  total at bedtime 02/04/16   [provider]  methylPREDNISolone (MEDROL DOSEPAK) 4 MG TBPK tablet Take as directed 04/11/18   Liberty HandyGibbons, Traci Plemons J, PA-C  polyethylene glycol (MIRALAX / GLYCOLAX) packet Take 17 g by mouth daily. 02/21/16   Mesner, Barbara CowerJason, MD  pregabalin (LYRICA) 75 MG capsule Take 75 mg by mouth daily.    [provider]  simvastatin (ZOCOR) 20 MG tablet Take 20 mg by mouth every evening.  01/06/16   [provider]  tiZANidine (ZANAFLEX) 4 MG tablet Take 4 mg by mouth 2 (two) times daily as  needed for muscle spasms.    [provider]    Family History Family History  Problem Relation Age of Onset  . Heart failure Mother   . Heart failure Father     Social History Social History   Tobacco Use  . Smoking status: Current Every Day Smoker    Packs/day: 0.50    Types: Cigarettes  . Smokeless tobacco: Never Used  Substance Use Topics  . Alcohol use: No  . Drug use: No     Allergies   Ultram [tramadol]   Review of Systems Review of Systems  Musculoskeletal: Positive for myalgias and neck pain.  All other systems reviewed and are negative.    Physical Exam Updated Vital Signs BP (!) 130/112 (BP Location: Left Arm)   Pulse 72   Temp 97.6 F (36.4 C) (Oral)   Resp 20   Ht 5' (1.524 m)   Wt 70.3 kg (155 lb)   SpO2 100%   BMI 30.27 kg/m   Physical Exam  Constitutional: She is oriented to person, place, and time. She appears well-developed and  well-nourished. No distress.  HENT:  Head: Normocephalic and atraumatic.  Right Ear: External ear normal.  Left Ear: External ear normal.  Nose: Nose normal.  Eyes: EOM and lids are normal.  Neck: Trachea normal and phonation normal. Muscular tenderness present. No thyromegaly present.    c-spine: bilateral muscular tenderness L>R. No midline c-spine tenderness or obvious deformity/step offs. Pain reported to bilateral sides of neck with active neck bend and rotation. Positive Spurling's. Negative Adson's. No rigidity or meningeal signs.Trachea midline. Thyroid non palpable.   Cardiovascular: Normal rate and regular rhythm.  2+ radial pulses bilaterally. Good cap refill to fingers bilaterally.   Pulmonary/Chest: Effort normal and breath sounds normal.  Musculoskeletal:       Cervical back: She exhibits tenderness.  Normal appearance of neck and upper extremities. No focal bony tenderness to upper extremities without edema, erythema, ecchymosis.   Full AROM of upper extremities without reported pain.   Shoulders: mild tenderness to top of trapezius and lateral deltoid L>R.  No obvious skin abnormalities including abrasions, ecchymosis, erythema, edema No point tenderness to sternum, anterior chest wall, scapula, clavicle, AC or Huerfano joints Full active ROM of shoulder with no reported pain Negative Hawkin's and Neer's test Negative Speed's and Yergason's test Negative drop arm test  Neurological: She is alert and oriented to person, place, and time.  Sensation to light touch intact in upper extremities 5/5 strength with hand grip and finger abduction bilaterally Brachioradialis DTRs symmetric bilaterally  Skin: Skin is warm and dry. Capillary refill takes less than 2 seconds.  No signs of trauma or rash to neck or upper extremities   Psychiatric: She has a normal mood and affect. Her behavior is normal. Thought content normal.     ED Treatments / Results  Labs (all labs ordered are  listed, but only abnormal results are displayed) Labs Reviewed - No data to display  EKG None  Radiology No results found.  Procedures Procedures (including critical care time)  Medications Ordered in ED Medications - No data to display   Initial Impression / Assessment and Plan / ED Course  I have reviewed the triage vital signs and the nursing notes.  Pertinent labs & imaging results that were available during my care of the patient were reviewed by me and considered in my medical decision making (see chart for details).     57 y.o. yo female  with pertinent pmh presents with bilateral, atraumatic posterior neck pain associated with radiation to trapezius and lateral deltoid x 5 days.  This is chronic issue x 20 years.  She has no tenderness between or at spinous processes at levels.  She has reproducible tenderness with AROM of neck and Spurling's maneuver suggestive of radiculopathy. There is no focal weakness to LUE.  No Lhermitte's phenomenon, no gait disturbances, sensory loss, weakness, muscle atrophy.  No recent fevers, chills, unexplained weight loss, immunosuppression, cancer or IVDU.  Doubt neck vessel dissection, meningitis, epidural abscess, bony fracture based on history and exam. ED imaging not indicated at this time as patient has no red flag symptoms or signs.  Given chronicity of symptoms, reassuring physical exam findings will treat for cervical radiculopathy with oral analgesics, prednisone.  Advised patient to avoid aggravating activities until symptoms start to improve.  Educated patient on red flag symptoms that would warrant return to ED, patient verbalized understanding.  Patient advised to f/u with PCP for possibly PT and/or MRI and nsgy consult for long term treatment of symptoms.    Final Clinical Impressions(s) / ED Diagnoses   Final diagnoses:  Chronic neck pain    ED Discharge Orders        Ordered    methylPREDNISolone (MEDROL DOSEPAK) 4 MG TBPK  tablet     04/11/18 1341       Liberty Handy, PA-C 04/11/18 1447    Samuel Jester, DO 04/12/18 [phone]

## 2018-05-29 ENCOUNTER — Other Ambulatory Visit (HOSPITAL_COMMUNITY): Payer: Self-pay | Admitting: Orthopedic Surgery

## 2018-05-29 DIAGNOSIS — M5412 Radiculopathy, cervical region: Secondary | ICD-10-CM

## 2018-06-06 ENCOUNTER — Ambulatory Visit (HOSPITAL_COMMUNITY)
Admission: RE | Admit: 2018-06-06 | Discharge: 2018-06-06 | Disposition: A | Discharge status: Home / Self Care | Payer: Medicare Other | Source: Ambulatory Visit | Attending: Orthopedic Surgery | Admitting: Orthopedic Surgery

## 2018-06-06 DIAGNOSIS — M5412 Radiculopathy, cervical region: Secondary | ICD-10-CM | POA: Diagnosis present

## 2018-06-06 DIAGNOSIS — M4802 Spinal stenosis, cervical region: Secondary | ICD-10-CM | POA: Diagnosis not present

## 2018-06-11 ENCOUNTER — Ambulatory Visit (HOSPITAL_COMMUNITY)
Admission: RE | Admit: 2018-06-11 | Discharge: 2018-06-11 | Disposition: A | Discharge status: Home / Self Care | Payer: Medicare Other | Source: Ambulatory Visit | Attending: Urology | Admitting: Urology

## 2018-06-11 ENCOUNTER — Other Ambulatory Visit: Payer: Self-pay | Admitting: Urology

## 2018-06-11 DIAGNOSIS — N200 Calculus of kidney: Secondary | ICD-10-CM

## 2018-06-12 ENCOUNTER — Other Ambulatory Visit: Payer: Self-pay | Admitting: Neurological Surgery

## 2018-06-13 ENCOUNTER — Encounter (HOSPITAL_COMMUNITY)
Admission: RE | Admit: 2018-06-13 | Discharge: 2018-06-13 | Disposition: A | Discharge status: Home / Self Care | Payer: Medicare Other | Source: Ambulatory Visit | Attending: Neurological Surgery | Admitting: Neurological Surgery

## 2018-06-13 ENCOUNTER — Other Ambulatory Visit: Payer: Self-pay

## 2018-06-13 ENCOUNTER — Encounter (HOSPITAL_COMMUNITY): Payer: Self-pay

## 2018-06-13 DIAGNOSIS — Z01818 Encounter for other preprocedural examination: Secondary | ICD-10-CM | POA: Insufficient documentation

## 2018-06-13 DIAGNOSIS — R7303 Prediabetes: Secondary | ICD-10-CM | POA: Diagnosis not present

## 2018-06-13 DIAGNOSIS — Z0183 Encounter for blood typing: Secondary | ICD-10-CM | POA: Insufficient documentation

## 2018-06-13 DIAGNOSIS — Z01812 Encounter for preprocedural laboratory examination: Secondary | ICD-10-CM | POA: Diagnosis not present

## 2018-06-13 DIAGNOSIS — R9431 Abnormal electrocardiogram [ECG] [EKG]: Secondary | ICD-10-CM | POA: Diagnosis not present

## 2018-06-13 HISTORY — DX: Cerebral infarction, unspecified: I63.9

## 2018-06-13 HISTORY — DX: Unspecified osteoarthritis, unspecified site: M19.90

## 2018-06-13 HISTORY — DX: Iron deficiency: E61.1

## 2018-06-13 HISTORY — DX: Pneumonia, unspecified organism: J18.9

## 2018-06-13 HISTORY — DX: Prediabetes: R73.03

## 2018-06-13 HISTORY — DX: Personal history of urinary calculi: Z87.442

## 2018-06-13 HISTORY — DX: Headache, unspecified: R51.9

## 2018-06-13 HISTORY — DX: Headache: R51

## 2018-06-13 HISTORY — DX: Cyst of kidney, acquired: N28.1

## 2018-06-13 LAB — BASIC METABOLIC PANEL
Anion gap: 10 (ref 5–15)
BUN: 6 mg/dL (ref 6–20)
CO2: 29 mmol/L (ref 22–32)
CREATININE: 0.79 mg/dL (ref 0.44–1.00)
Calcium: 9.4 mg/dL (ref 8.9–10.3)
Chloride: 102 mmol/L (ref 98–111)
Glucose, Bld: 108 mg/dL — ABNORMAL HIGH (ref 70–99)
POTASSIUM: 3.8 mmol/L (ref 3.5–5.1)
SODIUM: 141 mmol/L (ref 135–145)

## 2018-06-13 LAB — CBC
HEMATOCRIT: 45.5 % (ref 36.0–46.0)
Hemoglobin: 14.8 g/dL (ref 12.0–15.0)
MCH: 31.5 pg (ref 26.0–34.0)
MCHC: 32.5 g/dL (ref 30.0–36.0)
MCV: 96.8 fL (ref 78.0–100.0)
PLATELETS: 404 10*3/uL — AB (ref 150–400)
RBC: 4.7 MIL/uL (ref 3.87–5.11)
RDW: 13.6 % (ref 11.5–15.5)
WBC: 10.9 10*3/uL — AB (ref 4.0–10.5)

## 2018-06-13 LAB — TYPE AND SCREEN
ABO/RH(D): B POS
ANTIBODY SCREEN: NEGATIVE

## 2018-06-13 LAB — SURGICAL PCR SCREEN
MRSA, PCR: NEGATIVE
STAPHYLOCOCCUS AUREUS: NEGATIVE

## 2018-06-13 LAB — ABO/RH: ABO/RH(D): B POS

## 2018-06-13 NOTE — Progress Notes (Signed)
Pt denies cardiac history or HTN. Pt states she is pre-Diabetic, she does not take medications and does not check her blood sugar at home. States she had blood work done at Dr. Albertina ParrAvbuere's office today which included an A1C. I have requested that result and the last EKG that pt has had done.

## 2018-06-13 NOTE — Pre-Procedure Instructions (Addendum)
Kristen [name]  06/13/2018    Your procedure is scheduled on Tuesday, June 19, 2018 at 7:30 AM.   [facility] at 5:30 AM.   Call this number if you have problems the morning of surgery: [phone]   Questions prior to day of surgery, please call [phone] between 8 & 4 PM.   Remember:  Do not eat or drink after midnight Monday, 06/18/18.  Take these medicines the morning of surgery with A SIP OF WATER: Cetirizine (Zyrtec), Pregabalin (Lyrica), Tizanidine (Zanaflex), Hydrocodone - if needed  Stop Multivitamins and NSAIDS (Celebrex, Ibuprofen, Aleve, etc) as of today prior to surgery.  Do NOT smoke 24 hours prior to surgery.    Do not wear jewelry, make-up or nail polish.  Do not wear lotions, powders, perfumes or deodorant.  Men may shave face and neck.  Do not bring valuables to the hospital.  [facility] not responsible for any belongings or valuables.  Contacts, dentures or bridgework may not be worn into surgery.  Leave your suitcase in the car.  After surgery it may be brought to your room.  For patients admitted to the hospital, discharge time will be determined by your treatment team.  Patients discharged the day of surgery will not be allowed to drive home.   [facility] - Preparing for Surgery  Before surgery, you can play an important role.  Because skin is not sterile, your skin needs to be as free of germs as possible.  You can reduce the number of germs on you skin by washing with CHG (chlorahexidine gluconate) soap before surgery.  CHG is an antiseptic cleaner which kills germs and bonds with the skin to continue killing germs even after washing.  Oral Hygiene is also important in reducing the risk of infection.  Remember to brush your teeth with your regular toothpaste the morning of surgery.  Please DO NOT use if you have an allergy to CHG or antibacterial soaps.  If your skin becomes reddened/irritated  stop using the CHG and inform your nurse when you arrive at Short Stay.  Do not shave (including legs and underarms) for at least 48 hours prior to the first CHG shower.  You may shave your face.  Please follow these instructions carefully:   1.  Shower with CHG Soap the night before surgery and the morning of Surgery.  2.  If you choose to wash your hair, wash your hair first as usual with your normal shampoo.  3.  After you shampoo, rinse your hair and body thoroughly to remove the shampoo. 4.  Use CHG as you would any other liquid soap.  You can apply chg directly to the skin and wash gently with a      scrungie or washcloth.           5.  Apply the CHG Soap to your body ONLY FROM THE NECK DOWN.   Do not use on open wounds or open sores. Avoid contact with your eyes, ears, mouth and genitals (private parts).  Wash genitals (private parts) with your normal soap.  6.  Wash thoroughly, paying special attention to the area where your surgery will be performed.  7.  Thoroughly rinse your body with warm water from the neck down.  8.  DO NOT shower/wash with your normal soap after using and rinsing off the CHG Soap.  9.  Pat yourself dry with a clean towel.  10.  Wear clean pajamas.            11.  Place clean sheets on your bed the night of your first shower and do not sleep with pets.  Day of Surgery  Shower as above. Do not apply any lotions/deodorants the morning of surgery.   Please wear clean clothes to the hospital. Remember to brush your teeth with toothpaste.   Please read over the fact sheets that you were given.

## 2018-06-19 ENCOUNTER — Other Ambulatory Visit: Payer: Self-pay

## 2018-06-19 ENCOUNTER — Observation Stay (HOSPITAL_COMMUNITY)
Admission: RE | Admit: 2018-06-19 | Discharge: 2018-06-20 | Disposition: A | Discharge status: Home / Self Care | Payer: Medicare Other | Source: Ambulatory Visit | Attending: Neurological Surgery | Admitting: Neurological Surgery

## 2018-06-19 ENCOUNTER — Ambulatory Visit (HOSPITAL_COMMUNITY): Payer: Medicare Other | Admitting: Vascular Surgery

## 2018-06-19 ENCOUNTER — Ambulatory Visit (HOSPITAL_COMMUNITY): Payer: Medicare Other | Admitting: Certified Registered Nurse Anesthetist

## 2018-06-19 ENCOUNTER — Encounter (HOSPITAL_COMMUNITY)
Admission: RE | Disposition: A | Discharge status: Home / Self Care | Payer: Self-pay | Source: Ambulatory Visit | Attending: Neurological Surgery

## 2018-06-19 ENCOUNTER — Encounter (HOSPITAL_COMMUNITY): Payer: Self-pay | Admitting: *Deleted

## 2018-06-19 ENCOUNTER — Ambulatory Visit (HOSPITAL_COMMUNITY): Payer: Medicare Other

## 2018-06-19 DIAGNOSIS — M1990 Unspecified osteoarthritis, unspecified site: Secondary | ICD-10-CM | POA: Diagnosis not present

## 2018-06-19 DIAGNOSIS — Z87891 Personal history of nicotine dependence: Secondary | ICD-10-CM | POA: Diagnosis not present

## 2018-06-19 DIAGNOSIS — Z8673 Personal history of transient ischemic attack (TIA), and cerebral infarction without residual deficits: Secondary | ICD-10-CM | POA: Insufficient documentation

## 2018-06-19 DIAGNOSIS — M2578 Osteophyte, vertebrae: Secondary | ICD-10-CM | POA: Diagnosis not present

## 2018-06-19 DIAGNOSIS — Z87442 Personal history of urinary calculi: Secondary | ICD-10-CM | POA: Insufficient documentation

## 2018-06-19 DIAGNOSIS — Z79899 Other long term (current) drug therapy: Secondary | ICD-10-CM | POA: Insufficient documentation

## 2018-06-19 DIAGNOSIS — R51 Headache: Secondary | ICD-10-CM | POA: Insufficient documentation

## 2018-06-19 DIAGNOSIS — M4802 Spinal stenosis, cervical region: Secondary | ICD-10-CM | POA: Diagnosis present

## 2018-06-19 DIAGNOSIS — E119 Type 2 diabetes mellitus without complications: Secondary | ICD-10-CM | POA: Diagnosis not present

## 2018-06-19 DIAGNOSIS — Z8249 Family history of ischemic heart disease and other diseases of the circulatory system: Secondary | ICD-10-CM | POA: Insufficient documentation

## 2018-06-19 DIAGNOSIS — M5412 Radiculopathy, cervical region: Secondary | ICD-10-CM

## 2018-06-19 DIAGNOSIS — F419 Anxiety disorder, unspecified: Secondary | ICD-10-CM | POA: Diagnosis not present

## 2018-06-19 DIAGNOSIS — F329 Major depressive disorder, single episode, unspecified: Secondary | ICD-10-CM | POA: Insufficient documentation

## 2018-06-19 HISTORY — PX: ANTERIOR CERVICAL DECOMP/DISCECTOMY FUSION: SHX1161

## 2018-06-19 LAB — CBC
HCT: 40.9 % (ref 36.0–46.0)
Hemoglobin: 13.8 g/dL (ref 12.0–15.0)
MCH: 31.9 pg (ref 26.0–34.0)
MCHC: 33.7 g/dL (ref 30.0–36.0)
MCV: 94.5 fL (ref 78.0–100.0)
PLATELETS: 411 10*3/uL — AB (ref 150–400)
RBC: 4.33 MIL/uL (ref 3.87–5.11)
RDW: 13.2 % (ref 11.5–15.5)
WBC: 16.6 10*3/uL — AB (ref 4.0–10.5)

## 2018-06-19 LAB — HEMOGLOBIN A1C
HEMOGLOBIN A1C: 5.5 % (ref 4.8–5.6)
MEAN PLASMA GLUCOSE: 111.15 mg/dL

## 2018-06-19 LAB — CREATININE, SERUM
Creatinine, Ser: 0.77 mg/dL (ref 0.44–1.00)
GFR calc Af Amer: >60 mL/min (ref >60)
GFR calc non Af Amer: >60 mL/min (ref >60)

## 2018-06-19 SURGERY — ANTERIOR CERVICAL DECOMPRESSION/DISCECTOMY FUSION 3 LEVELS
Anesthesia: General

## 2018-06-19 MED ORDER — 0.9 % SODIUM CHLORIDE (POUR BTL) OPTIME
TOPICAL | Status: DC | PRN
  Administered 2018-06-19: 1000 mL

## 2018-06-19 MED ORDER — NEOSTIGMINE METHYLSULFATE 5 MG/5ML IV SOSY
PREFILLED_SYRINGE | INTRAVENOUS | Status: DC | PRN
  Administered 2018-06-19: 2 mg via INTRAVENOUS

## 2018-06-19 MED ORDER — ROCURONIUM BROMIDE 10 MG/ML (PF) SYRINGE
PREFILLED_SYRINGE | INTRAVENOUS | Status: AC
  Filled 2018-06-19: qty 10

## 2018-06-19 MED ORDER — DEXAMETHASONE SODIUM PHOSPHATE 10 MG/ML IJ SOLN
INTRAMUSCULAR | Status: AC
  Filled 2018-06-19: qty 1

## 2018-06-19 MED ORDER — ACETAMINOPHEN 325 MG PO TABS: 650.0000 mg | ORAL_TABLET | Freq: Four times a day (QID) | ORAL | Status: DC | PRN

## 2018-06-19 MED ORDER — CEFAZOLIN SODIUM-DEXTROSE 2-3 GM-%(50ML) IV SOLR
INTRAVENOUS | Status: DC | PRN
  Administered 2018-06-19 (×2): 2 g via INTRAVENOUS

## 2018-06-19 MED ORDER — AMITRIPTYLINE HCL 50 MG PO TABS
50.0000 mg | ORAL_TABLET | Freq: Every day | ORAL | Status: DC
  Filled 2018-06-19 (×2): qty 1

## 2018-06-19 MED ORDER — PROPOFOL 10 MG/ML IV BOLUS
INTRAVENOUS | Status: DC | PRN
  Administered 2018-06-19: 10 mg via INTRAVENOUS
  Administered 2018-06-19: 110 mg via INTRAVENOUS

## 2018-06-19 MED ORDER — FENTANYL CITRATE (PF) 100 MCG/2ML IJ SOLN
INTRAMUSCULAR | Status: AC
  Filled 2018-06-19: qty 2

## 2018-06-19 MED ORDER — OXYCODONE HCL 5 MG PO TABS
ORAL_TABLET | ORAL | Status: AC
  Filled 2018-06-19: qty 2

## 2018-06-19 MED ORDER — LIDOCAINE 2% (20 MG/ML) 5 ML SYRINGE
INTRAMUSCULAR | Status: DC | PRN
  Administered 2018-06-19: 60 mg via INTRAVENOUS

## 2018-06-19 MED ORDER — ROCURONIUM BROMIDE 10 MG/ML (PF) SYRINGE
PREFILLED_SYRINGE | INTRAVENOUS | Status: DC | PRN
  Administered 2018-06-19: 50 mg via INTRAVENOUS

## 2018-06-19 MED ORDER — MENTHOL 3 MG MT LOZG: 1.0000 | LOZENGE | OROMUCOSAL | Status: DC | PRN

## 2018-06-19 MED ORDER — LACTATED RINGERS IV SOLN
INTRAVENOUS | Status: DC
  Administered 2018-06-19: 10:00:00 via INTRAVENOUS
  Administered 2018-06-19: 50 mL/h via INTRAVENOUS

## 2018-06-19 MED ORDER — ONDANSETRON HCL 4 MG/2ML IJ SOLN: 4.0000 mg | Freq: Four times a day (QID) | INTRAMUSCULAR | Status: DC | PRN

## 2018-06-19 MED ORDER — FENTANYL CITRATE (PF) 250 MCG/5ML IJ SOLN
INTRAMUSCULAR | Status: AC
  Filled 2018-06-19: qty 5

## 2018-06-19 MED ORDER — SODIUM CHLORIDE 0.9 % IV SOLN
INTRAVENOUS | Status: DC | PRN
  Administered 2018-06-19: 10:00:00

## 2018-06-19 MED ORDER — OXYCODONE HCL 5 MG PO TABS: 5.0000 mg | ORAL_TABLET | ORAL | Status: DC | PRN

## 2018-06-19 MED ORDER — FENTANYL CITRATE (PF) 100 MCG/2ML IJ SOLN
25.0000 ug | INTRAMUSCULAR | Status: DC | PRN
  Administered 2018-06-19 (×4): 25 ug via INTRAVENOUS

## 2018-06-19 MED ORDER — SIMVASTATIN 20 MG PO TABS
20.0000 mg | ORAL_TABLET | Freq: Every evening | ORAL | Status: DC
  Administered 2018-06-19: 20 mg via ORAL
  Filled 2018-06-19: qty 1

## 2018-06-19 MED ORDER — DEXAMETHASONE SODIUM PHOSPHATE 10 MG/ML IJ SOLN
INTRAMUSCULAR | Status: DC | PRN
  Administered 2018-06-19: 10 mg via INTRAVENOUS

## 2018-06-19 MED ORDER — TIZANIDINE HCL 4 MG PO TABS
4.0000 mg | ORAL_TABLET | Freq: Three times a day (TID) | ORAL | Status: DC
  Administered 2018-06-19: 4 mg via ORAL
  Filled 2018-06-19 (×2): qty 1

## 2018-06-19 MED ORDER — LIDOCAINE-EPINEPHRINE 1 %-1:100000 IJ SOLN
INTRAMUSCULAR | Status: AC
  Filled 2018-06-19: qty 1

## 2018-06-19 MED ORDER — LIDOCAINE 2% (20 MG/ML) 5 ML SYRINGE
INTRAMUSCULAR | Status: AC
  Filled 2018-06-19: qty 5

## 2018-06-19 MED ORDER — CEFAZOLIN SODIUM 1 G IJ SOLR
INTRAMUSCULAR | Status: AC
  Filled 2018-06-19: qty 20

## 2018-06-19 MED ORDER — LIDOCAINE-EPINEPHRINE 1 %-1:100000 IJ SOLN
INTRAMUSCULAR | Status: DC | PRN
  Administered 2018-06-19: 7 mL

## 2018-06-19 MED ORDER — SODIUM CHLORIDE 0.9 % IV SOLN
INTRAVENOUS | Status: DC | PRN
  Administered 2018-06-19: 25 ug/min via INTRAVENOUS

## 2018-06-19 MED ORDER — CEFAZOLIN SODIUM-DEXTROSE 2-4 GM/100ML-% IV SOLN
INTRAVENOUS | Status: AC
  Filled 2018-06-19: qty 100

## 2018-06-19 MED ORDER — LORATADINE 10 MG PO TABS: 10.0000 mg | ORAL_TABLET | Freq: Every day | ORAL | Status: DC

## 2018-06-19 MED ORDER — ONDANSETRON HCL 4 MG/2ML IJ SOLN
INTRAMUSCULAR | Status: AC
  Filled 2018-06-19: qty 2

## 2018-06-19 MED ORDER — GLYCOPYRROLATE PF 0.2 MG/ML IJ SOSY
PREFILLED_SYRINGE | INTRAMUSCULAR | Status: DC | PRN
  Administered 2018-06-19: .3 mg via INTRAVENOUS

## 2018-06-19 MED ORDER — HEPARIN SODIUM (PORCINE) 5000 UNIT/ML IJ SOLN: 5000.0000 [IU] | Freq: Three times a day (TID) | INTRAMUSCULAR | Status: DC

## 2018-06-19 MED ORDER — NYSTATIN 100000 UNIT/GM EX CREA
1.0000 "application " | TOPICAL_CREAM | Freq: Every day | CUTANEOUS | Status: DC | PRN
  Filled 2018-06-19: qty 15

## 2018-06-19 MED ORDER — PROPOFOL 10 MG/ML IV BOLUS
INTRAVENOUS | Status: AC
  Filled 2018-06-19: qty 40

## 2018-06-19 MED ORDER — OXYCODONE HCL 5 MG PO TABS
10.0000 mg | ORAL_TABLET | ORAL | Status: DC | PRN
  Administered 2018-06-19: 10 mg via ORAL

## 2018-06-19 MED ORDER — MIDAZOLAM HCL 2 MG/2ML IJ SOLN
INTRAMUSCULAR | Status: AC
  Filled 2018-06-19: qty 2

## 2018-06-19 MED ORDER — HEMOSTATIC AGENTS (NO CHARGE) OPTIME
TOPICAL | Status: DC | PRN
  Administered 2018-06-19: 1 via TOPICAL

## 2018-06-19 MED ORDER — PREGABALIN 75 MG PO CAPS
75.0000 mg | ORAL_CAPSULE | Freq: Every day | ORAL | Status: DC
  Administered 2018-06-19: 75 mg via ORAL
  Filled 2018-06-19: qty 1

## 2018-06-19 MED ORDER — FENTANYL CITRATE (PF) 100 MCG/2ML IJ SOLN
INTRAMUSCULAR | Status: DC | PRN
  Administered 2018-06-19 (×7): 50 ug via INTRAVENOUS
  Administered 2018-06-19: 100 ug via INTRAVENOUS
  Administered 2018-06-19: 50 ug via INTRAVENOUS

## 2018-06-19 MED ORDER — MIDAZOLAM HCL 5 MG/5ML IJ SOLN
INTRAMUSCULAR | Status: DC | PRN
  Administered 2018-06-19: 2 mg via INTRAVENOUS

## 2018-06-19 MED ORDER — CELECOXIB 200 MG PO CAPS
200.0000 mg | ORAL_CAPSULE | Freq: Every day | ORAL | Status: DC
  Administered 2018-06-19: 200 mg via ORAL
  Filled 2018-06-19: qty 1

## 2018-06-19 MED ORDER — HYDROXYZINE HCL 50 MG/ML IM SOLN
50.0000 mg | Freq: Four times a day (QID) | INTRAMUSCULAR | Status: DC | PRN
  Administered 2018-06-19: 50 mg via INTRAMUSCULAR
  Filled 2018-06-19: qty 1

## 2018-06-19 MED ORDER — ONDANSETRON HCL 4 MG/2ML IJ SOLN
INTRAMUSCULAR | Status: DC | PRN
  Administered 2018-06-19: 4 mg via INTRAVENOUS

## 2018-06-19 MED ORDER — PHENOL 1.4 % MT LIQD: 1.0000 | OROMUCOSAL | Status: DC | PRN

## 2018-06-19 SURGICAL SUPPLY — 61 items
BAG DECANTER FOR FLEXI CONT (MISCELLANEOUS) ×3 IMPLANT
BASKET BONE COLLECTION (BASKET) IMPLANT
BENZOIN TINCTURE PRP APPL 2/3 (GAUZE/BANDAGES/DRESSINGS) IMPLANT
BLADE CLIPPER SURG (BLADE) IMPLANT
BLADE SURG 11 STRL SS (BLADE) ×3 IMPLANT
BLADE ULTRA TIP 2M (BLADE) IMPLANT
BUR MATCHSTICK NEURO 3.0 LAGG (BURR) ×3 IMPLANT
CANISTER SUCT 3000ML PPV (MISCELLANEOUS) ×3 IMPLANT
CLOSURE WOUND 1/2 X4 (GAUZE/BANDAGES/DRESSINGS)
DECANTER SPIKE VIAL GLASS SM (MISCELLANEOUS) ×3 IMPLANT
DERMABOND ADVANCED (GAUZE/BANDAGES/DRESSINGS) ×2
DERMABOND ADVANCED .7 DNX12 (GAUZE/BANDAGES/DRESSINGS) ×1 IMPLANT
DRAPE C-ARM 42X72 X-RAY (DRAPES) ×6 IMPLANT
DRAPE HALF SHEET 40X57 (DRAPES) IMPLANT
DRAPE LAPAROTOMY 100X72 PEDS (DRAPES) ×3 IMPLANT
DRAPE MICROSCOPE LEICA (MISCELLANEOUS) IMPLANT
DURAPREP 6ML APPLICATOR 50/CS (WOUND CARE) ×3 IMPLANT
ELECT COATED BLADE 2.86 ST (ELECTRODE) ×3 IMPLANT
ELECT REM PT RETURN 9FT ADLT (ELECTROSURGICAL) ×3
ELECTRODE REM PT RTRN 9FT ADLT (ELECTROSURGICAL) ×1 IMPLANT
FLOSEAL 5ML (HEMOSTASIS) ×3 IMPLANT
GAUZE 4X4 16PLY RFD (DISPOSABLE) IMPLANT
GLOVE BIO SURGEON STRL SZ7.5 (GLOVE) ×3 IMPLANT
GLOVE BIOGEL PI IND STRL 7.5 (GLOVE) ×2 IMPLANT
GLOVE BIOGEL PI INDICATOR 7.5 (GLOVE) ×4
GLOVE EXAM NITRILE LRG STRL (GLOVE) IMPLANT
GLOVE EXAM NITRILE XL STR (GLOVE) IMPLANT
GLOVE EXAM NITRILE XS STR PU (GLOVE) IMPLANT
GOWN STRL REUS W/ TWL LRG LVL3 (GOWN DISPOSABLE) ×2 IMPLANT
GOWN STRL REUS W/ TWL XL LVL3 (GOWN DISPOSABLE) IMPLANT
GOWN STRL REUS W/TWL 2XL LVL3 (GOWN DISPOSABLE) IMPLANT
GOWN STRL REUS W/TWL LRG LVL3 (GOWN DISPOSABLE) ×4
GOWN STRL REUS W/TWL XL LVL3 (GOWN DISPOSABLE)
HEMOSTAT POWDER KIT SURGIFOAM (HEMOSTASIS) ×3 IMPLANT
KIT BASIN OR (CUSTOM PROCEDURE TRAY) ×3 IMPLANT
KIT TURNOVER KIT B (KITS) ×3 IMPLANT
NEEDLE HYPO 22GX1.5 SAFETY (NEEDLE) ×3 IMPLANT
NEEDLE SPNL 22GX3.5 QUINCKE BK (NEEDLE) ×3 IMPLANT
NS IRRIG 1000ML POUR BTL (IV SOLUTION) ×3 IMPLANT
PACK LAMINECTOMY NEURO (CUSTOM PROCEDURE TRAY) ×3 IMPLANT
PAD ARMBOARD 7.5X6 YLW CONV (MISCELLANEOUS) ×9 IMPLANT
PASTE BONE GRAFTON 1CC (Bone Implant) ×3 IMPLANT
PIN FIXATION (PIN) ×3 IMPLANT
PIN FIXATION HOLDING F/PLATE 2 (Plate) ×6 IMPLANT
PLATE 3 57.5XLCK NS SPNE CVD (Plate) ×1 IMPLANT
PLATE 3 ATLANTIS TRANS (Plate) ×2 IMPLANT
RUBBERBAND STERILE (MISCELLANEOUS) ×6 IMPLANT
SCREW SELF TAP VAR 4.0X13 (Screw) ×24 IMPLANT
SPACER BONE CORNERSTONE 6X14 (Orthopedic Implant) ×9 IMPLANT
SPONGE INTESTINAL PEANUT (DISPOSABLE) ×3 IMPLANT
SPONGE SURGIFOAM ABS GEL SZ50 (HEMOSTASIS) IMPLANT
STAPLER VISISTAT 35W (STAPLE) ×3 IMPLANT
STRIP CLOSURE SKIN 1/2X4 (GAUZE/BANDAGES/DRESSINGS) IMPLANT
SUT MNCRL AB 3-0 PS2 18 (SUTURE) ×3 IMPLANT
SUT VIC AB 3-0 SH 8-18 (SUTURE) ×3 IMPLANT
SUT VIC AB 4-0 RB1 18 (SUTURE) IMPLANT
SUT VICRYL 3-0 RB1 18 ABS (SUTURE) IMPLANT
TAPE CLOTH 3X10 TAN LF (GAUZE/BANDAGES/DRESSINGS) ×3 IMPLANT
TOWEL GREEN STERILE (TOWEL DISPOSABLE) ×3 IMPLANT
TOWEL GREEN STERILE FF (TOWEL DISPOSABLE) ×3 IMPLANT
WATER STERILE IRR 1000ML POUR (IV SOLUTION) ×3 IMPLANT

## 2018-06-19 NOTE — Anesthesia Preprocedure Evaluation (Addendum)
Anesthesia Evaluation  Patient identified by MRN, date of birth, ID band Patient awake    Reviewed: Allergy & Precautions, NPO status , Patient's Chart, lab work & pertinent test results  History of Anesthesia Complications Negative for: history of anesthetic complications  Airway Mallampati: II  TM Distance: >3 FB Neck ROM: Full    Dental  (+) Dental Advisory Given   Pulmonary neg shortness of breath, neg COPD, neg recent URI, Current Smoker,    breath sounds clear to auscultation       Cardiovascular negative cardio ROS   Rhythm:Regular     Neuro/Psych  Headaches, PSYCHIATRIC DISORDERS Anxiety Depression TIA Neuromuscular disease    GI/Hepatic negative GI ROS, Neg liver ROS,   Endo/Other  diabetes  Renal/GU Renal diseaseRenal cyst, right kidney stone     Musculoskeletal  (+) Arthritis ,   Abdominal   Peds  Hematology negative hematology ROS (+)   Anesthesia Other Findings   Reproductive/Obstetrics                            Anesthesia Physical Anesthesia Plan  ASA: II  Anesthesia Plan: General   Post-op Pain Management:    Induction: Intravenous  PONV Risk Score and Plan: 2 and Ondansetron and Dexamethasone  Airway Management Planned: Oral ETT  Additional Equipment: None  Intra-op Plan:   Post-operative Plan: Extubation in OR  Informed Consent: I have reviewed the patients History and Physical, chart, labs and discussed the procedure including the risks, benefits and alternatives for the proposed anesthesia with the patient or authorized representative who has indicated his/her understanding and acceptance.   Dental advisory given  Plan Discussed with: CRNA and Surgeon  Anesthesia Plan Comments:         Anesthesia Quick Evaluation

## 2018-06-19 NOTE — Brief Op Note (Signed)
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 Altamease OilerAndy Pool   ANESTHESIA: ETGA   EBL:  50 mL    BLOOD ADMINISTERED: none   DRAINS: None   LOCAL MEDICATIONS USED:  LIDOCAINE    SPECIMEN:  None   COUNTS:  YES  DICTATION: Note written in EPIC   PLAN OF CARE AND DISPOSITION: Admit to inpatient    Delay start of Pharmacological VTE agent (>24hrs) due to surgical blood loss or risk of bleeding: yes, start on POD2  Jadene Pierinihomas A Kanon Colunga, MD 06/19/18 2:05 PM

## 2018-06-19 NOTE — H&P (Signed)
Surgical H&P  HPI: 57yo woman w/ bilateral radicular pain, here for surgical treatment. No changes in health since she was last seen.   Physical Exam: Neuro: AOx3, PERRL, FS, TM, Strength 5/5 x4, SILTx4,   Assesment/Plan: 56yo woman w/ bilateral foraminal stenosis at C4-5, C5-6, and C6-7, planned three level ACDF. Risks, benefits, and alternatives discussed and the patient would like to continue with surgery.

## 2018-06-19 NOTE — Plan of Care (Signed)
  Problem: Safety: Goal: Ability to remain free from injury will improve Outcome: Progressing   

## 2018-06-19 NOTE — Transfer of Care (Signed)
Immediate Anesthesia Transfer of Care Note  Patient: Kristen [name]  Procedure(s) Performed: Cervical 4-5 Cervical 5-6 Cervical 6-7 Anterior cervical decompression/discectomy/fusion/right side approach (N/A )  Patient Location: PACU  Anesthesia Type:General  Level of Consciousness: awake, oriented, patient cooperative and responds to stimulation  Airway & Oxygen Therapy: Patient Spontanous Breathing and Patient connected to nasal cannula oxygen  Post-op Assessment: Report given to RN, Post -op Vital signs reviewed and unstable, Anesthesiologist notified and Patient moving all extremities X 4  Post vital signs: Reviewed and stable  Last Vitals:  Vitals Value Taken Time  BP 149/89 06/19/2018  1:59 PM  Temp 36.3 C 06/19/2018  2:00 PM  Pulse 100 06/19/2018  2:04 PM  Resp 16 06/19/2018  2:04 PM  SpO2 100 % 06/19/2018  2:04 PM  Vitals shown include unvalidated device data.  Last Pain:  Vitals:   06/19/18 0831  PainSc: 10-Worst pain ever         Complications: No apparent anesthesia complications

## 2018-06-19 NOTE — Op Note (Signed)
PATIENT: Kristen [name]  DATE:06/19/18   PRE-OPERATIVE DIAGNOSIS:  Bilateral cervical foraminal stenosis C4-5, C5-6, C6-7   POST-OPERATIVE DIAGNOSIS:  Bilateral cervical foraminal stenosis C4-5, C5-6, C6-7   PROCEDURE:  C4-5, C5-6, C6-7 Anterior Cervical Discectomy and Instrumented Fusion   SURGEON:  Surgeon(s) and Role:    Kristen Pierinistergard, Kristen Garmany A, MD - Primary    Dr. Altamease OilerAndy Pool, MD - Assisting   ANESTHESIA: ETGA   BRIEF HISTORY: This is [name] who presented with bilateral radicular pain that failed conservative management. This was discussed with the patient as well as risks, benefits, and alternatives and female wished to proceed with surgical evacuation.   OPERATIVE DETAIL: The patient was taken to the operating room and placed on the OR table in the supine position. [name] formal time out was performed with two patient identifiers and confirmed the operative site. Anesthesia was induced by the anesthesia team. The operative site was marked, the area was then prepped and draped in [name] sterile fashion. [name] transverse linear incision was placed in [name] crease in the right side of anterior neck. [name] standard anterior cervical dissection was performed to expose the cervical spine. Longus colli was mobilized laterally and retractors were placed. The operative level was confirmed via fluoroscopy and [name] discectomy was performed. The discectomy consisted of complete disc removal, osteophyte removal, and complete bilateral foraminotomies. This was then repeated at the other two levels. Interbody cortical grafts were then trialed and placed followed by placement of an anterior cervical plate ([employer]). Fluoroscopy confirmed that the hardware was in good position. Hemostasis was obtained and the incision was closed in layers in the usual fashion. All instrument and sponge counts were correct, the incision was then closed in layers. The patient was then returned to anesthesia for emergence. No complications were  apparent at the completion of the procedure.   EBL:  50mL   DRAINS: None   SPECIMENS: none   Kristen Pierinihomas [name] Aayansh Codispoti, MD @TODAY @ 2:18 PM

## 2018-06-20 ENCOUNTER — Inpatient Hospital Stay (HOSPITAL_COMMUNITY): Payer: Medicare Other

## 2018-06-20 DIAGNOSIS — M5412 Radiculopathy, cervical region: Secondary | ICD-10-CM | POA: Diagnosis present

## 2018-06-20 DIAGNOSIS — M4802 Spinal stenosis, cervical region: Secondary | ICD-10-CM | POA: Diagnosis not present

## 2018-06-20 LAB — HIV ANTIBODY (ROUTINE TESTING W REFLEX): HIV Screen 4th Generation wRfx: NONREACTIVE

## 2018-06-20 MED ORDER — OXYCODONE HCL 5 MG PO TABS: 5.0000 mg | ORAL_TABLET | ORAL | 0 refills | Status: DC | PRN

## 2018-06-20 MED ORDER — MENTHOL 3 MG MT LOZG: 1.0000 | LOZENGE | OROMUCOSAL | 12 refills | Status: DC | PRN

## 2018-06-20 NOTE — Anesthesia Postprocedure Evaluation (Signed)
Anesthesia Post Note  Patient: [name]  Procedure(s) Performed: Cervical 4-5 Cervical 5-6 Cervical 6-7 Anterior cervical decompression/discectomy/fusion/right side approach (N/A )     Patient location during evaluation: PACU Anesthesia Type: General Level of consciousness: awake and alert Pain management: pain level controlled Vital Signs Assessment: post-procedure vital signs reviewed and stable Respiratory status: spontaneous breathing, nonlabored ventilation, respiratory function stable and patient connected to nasal cannula oxygen Cardiovascular status: blood pressure returned to baseline and stable Postop Assessment: no apparent nausea or vomiting Anesthetic complications: no    Last Vitals:  Vitals:   06/20/18 0326 06/20/18 0743  BP: (!) 146/84 125/80  Pulse: 81 78  Resp: 20 18  Temp: 36.8 C 37.1 C  SpO2: 92% 95%    Last Pain:  Vitals:   06/20/18 0800  TempSrc:   PainSc: 2                  Kristen [name]

## 2018-06-20 NOTE — Evaluation (Addendum)
Occupational Therapy Evaluation and Discharge Patient Details Name: Kristen [name] R Owen MRN: [id number] DOB: 01/17/1961 Today's Date: 06/20/2018    History of Present Illness Pt is a 57 y.o. female s/p C4-5. C5-6. C6-7 ACDF. She has a PMH significant for pre-diabetes.    Clinical Impression   PTA, pt was independent with ADL and functional mobility. She currently is limited by cervical and lower back pain but very willing to participate with OT and eager to return to independence. Educated pt concerning all cervical precautions related to ADL participation as well as compensatory strategies for dressing, bathing, grooming, and toileting tasks. Pt is able to complete all ADL and ADL transfers with overall supervision at this time. She verbalizes and demonstrates understanding of all precautions and education topics. Her family will be able to provide 24 hour assistance post-acute D/C. No further acute OT needs identified. OT will sign off.     Follow Up Recommendations  Supervision/Assistance - 24 hour;Follow surgeon's recommendation for DC plan and follow-up therapies(initial 24 hour assist)    Equipment Recommendations  None recommended by OT    Recommendations for Other Services       Precautions / Restrictions Precautions Precautions: Cervical Precaution Booklet Issued: Yes (comment) Precaution Comments: Handout provided and all cervical precautions reviewed.  Required Braces or Orthoses: (no orders for brace) Restrictions Weight Bearing Restrictions: No      Mobility Bed Mobility Overal bed mobility: Needs Assistance Bed Mobility: Rolling;Sidelying to Sit Rolling: Supervision Sidelying to sit: Supervision       General bed mobility comments: Cues for log roll technique.   Transfers Overall transfer level: Needs assistance Equipment used: None Transfers: Sit to/from Stand Sit to Stand: Supervision         General transfer comment: Supervision for safety.      Balance Overall balance assessment: Mild deficits observed, not formally tested                                         ADL either performed or assessed with clinical judgement   ADL Overall ADL's : Needs assistance/impaired Eating/Feeding: Sitting;Set up   Grooming: Supervision/safety;Standing Grooming Details (indicate cue type and reason): educated concerning use of 2 cup method for oral care at sink Upper Body Bathing: Supervision/ safety;Sitting Upper Body Bathing Details (indicate cue type and reason): educated concerning cues to avoid overhead reach Lower Body Bathing: Supervison/ safety;Sit to/from stand   Upper Body Dressing : Supervision/safety;Sitting Upper Body Dressing Details (indicate cue type and reason): educated concerning methods to avoid overhead reach Lower Body Dressing: Supervision/safety;Sit to/from stand   Toilet Transfer: Supervision/safety;Ambulation   Toileting- Clothing Manipulation and Hygiene: Supervision/safety;Sit to/from stand   Tub/ Shower Transfer: Supervision/safety;Ambulation;Tub transfer   Functional mobility during ADLs: Supervision/safety General ADL Comments: Pt educated concerning all cervical precautions and safe body mechanics. Additionally educated concerning compensatory strategies to maximize safety and adherence to cervical precautions throughout tasks.      Vision Patient Visual Report: No change from baseline Vision Assessment?: No apparent visual deficits     Perception     Praxis      Pertinent Vitals/Pain Pain Assessment: Faces Faces Pain Scale: Hurts little more Pain Location: anterior neck; lower back Pain Descriptors / Indicators: Aching;Grimacing;Discomfort;Sore Pain Intervention(s): Limited activity within patient's tolerance;Monitored during session;Repositioned     Hand Dominance     Extremity/Trunk Assessment Upper Extremity Assessment Upper Extremity Assessment: Overall  WFL for tasks  assessed(limited shoulder flexion per protocol; no pain or numbness)   Lower Extremity Assessment Lower Extremity Assessment: Generalized weakness       Communication Communication Communication: No difficulties   Cognition Arousal/Alertness: Awake/alert Behavior During Therapy: WFL for tasks assessed/performed Overall Cognitive Status: Within Functional Limits for tasks assessed                                     General Comments  Pt reporting that her brother and daughter will be able to provide 24 hour assistance. She also reports that she is trying to quit smoking.  Educated concerning use of incentive spirometer.     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         OT Problem List: Decreased strength;Decreased activity tolerance;Impaired balance (sitting and/or standing);Decreased safety awareness;Decreased knowledge of use of DME or AE;Decreased knowledge of precautions;Pain;Impaired UE functional use      OT Treatment/Interventions:      OT Goals(Current goals can be found in the care plan section) Acute Rehab OT Goals Patient Stated Goal: to brush her hair OT Goal Formulation: With patient  OT Frequency:     Barriers to D/C:            Co-evaluation              AM-PAC PT "6 Clicks" Daily Activity     Outcome Measure Help from another person eating meals?: None Help from another person taking care of personal grooming?: None Help from another person toileting,  which includes using toliet, bedpan, or urinal?: None Help from another person bathing (including washing, rinsing, drying)?: None Help from another person to put on and taking off regular upper body clothing?: None Help from another person to put on and taking off regular lower body clothing?: None 6 Click Score: 24   End of Session Nurse Communication: Mobility status;Other (comment)  Activity Tolerance: Patient tolerated treatment well Patient left: in chair;with call bell/phone within reach  OT Visit Diagnosis: Other abnormalities of gait and mobility (R26.89);Pain Pain - part of body: (neck)                Time: [id number] OT Time Calculation (min): 36 min Charges:  OT General Charges $OT Visit: 1 Visit OT Evaluation $OT Eval Low Complexity: 1 Low OT Treatments $Self Care/Home Management : 8-22 mins  Doristine Sectionharity A Sherelle Castelli, MS OTR/L  Pager: [phone]   Calen Posch A Tykesha Konicki 06/20/2018, 9:19 AM

## 2018-06-20 NOTE — Progress Notes (Signed)
Neurosurgery Service Progress Note  Subjective: No acute events overnight. Arm pain completely resolved in bilateral upper extremities, some posterior interscapular pain, feeling well.  Objective: Vitals:   06/19/18 1549 06/19/18 1942 06/19/18 2316 06/20/18 0326  BP: 131/85 (!) 145/86 (!) 107/59 (!) 146/84  Pulse: 94 92 86 81  Resp: 16 18 18 20   Temp: 98.1 F (36.7 C) 98.2 F (36.8 C) 98.3 F (36.8 C) 98.2 F (36.8 C)  TempSrc: Oral Oral Oral Oral  SpO2: 95% 92% 97% 92%  Weight:      Height:       Temp (24hrs), Avg:97.9 F (36.6 C), Min:97.3 F (36.3 C), Max:98.3 F (36.8 C)  CBC Latest Ref Rng & Units 06/19/2018 06/13/2018 02/21/2016  WBC 4.0 - 10.5 K/uL 16.6(H) 10.9(H) 12.8(H)  Hemoglobin 12.0 - 15.0 g/dL 16.113.8 09.614.8 04.515.0  Hematocrit 36.0 - 46.0 % 40.9 45.5 44.3  Platelets 150 - 400 K/uL 411(H) 404(H) 450(H)   BMP Latest Ref Rng & Units 06/19/2018 06/13/2018 02/21/2016  Glucose 70 - 99 mg/dL - 409(W108(H) 119(J121(H)  BUN 6 - 20 mg/dL - 6 8  Creatinine 4.780.44 - 1.00 mg/dL 2.950.77 6.210.79 3.080.80  Sodium 135 - 145 mmol/L - 141 141  Potassium 3.5 - 5.1 mmol/L - 3.8 3.7  Chloride 98 - 111 mmol/L - 102 106  CO2 22 - 32 mmol/L - 29 26  Calcium 8.9 - 10.3 mg/dL - 9.4 9.6    Intake/Output Summary (Last 24 hours) at 06/20/2018 0901 Last data filed at 06/19/2018 1520 Gross per 24 hour  Intake 1725 ml  Output 665 ml  Net 1060 ml    Current Facility-Administered Medications:  .  acetaminophen (TYLENOL) tablet 650 mg, 650 mg, Oral, Q6H PRN, Ostergard, Thomas A, MD .  amitriptyline (ELAVIL) tablet 50 mg, 50 mg, Oral, QHS, Ostergard, Thomas A, MD .  celecoxib (CELEBREX) capsule 200 mg, 200 mg, Oral, Daily, Ostergard, Thomas A, MD, 200 mg at 06/19/18 1643 .  [START ON 06/21/2018] heparin injection 5,000 Units, 5,000 Units, Subcutaneous, Q8H, Ostergard, Thomas A, MD .  hydrOXYzine (VISTARIL) injection 50 mg, 50 mg, Intramuscular, Q6H PRN, Jadene Pierinistergard, Thomas A, MD, 50 mg at 06/19/18 1643 .  lactated ringers  infusion, , Intravenous, Continuous, Shelton SilvasHollis, Kevin D, MD, Last Rate: 50 mL/hr at 06/19/18 0908 .  loratadine (CLARITIN) tablet 10 mg, 10 mg, Oral, Daily, Ostergard, Thomas A, MD .  menthol-cetylpyridinium (CEPACOL) lozenge 3 mg, 1 lozenge, Oral, PRN, Jadene Pierinistergard, Thomas A, MD .  nystatin cream (MYCOSTATIN) 1 application, 1 application, Topical, Daily PRN, Jadene Pierinistergard, Thomas A, MD .  ondansetron (ZOFRAN) injection 4 mg, 4 mg, Intravenous, Q6H PRN, Jadene Pierinistergard, Thomas A, MD .  oxyCODONE (Oxy IR/ROXICODONE) immediate release tablet 10 mg, 10 mg, Oral, Q4H PRN, Jadene Pierinistergard, Thomas A, MD, 10 mg at 06/19/18 1500 .  oxyCODONE (Oxy IR/ROXICODONE) immediate release tablet 5 mg, 5 mg, Oral, Q4H PRN, Ostergard, Thomas A, MD .  phenol (CHLORASEPTIC) mouth spray 1 spray, 1 spray, Mouth/Throat, PRN, Ostergard, Thomas A, MD .  pregabalin (LYRICA) capsule 75 mg, 75 mg, Oral, Daily, Jadene Pierinistergard, Thomas A, MD, 75 mg at 06/19/18 1643 .  simvastatin (ZOCOR) tablet 20 mg, 20 mg, Oral, QPM, Ostergard, Thomas A, MD, 20 mg at 06/19/18 1643 .  tiZANidine (ZANAFLEX) tablet 4 mg, 4 mg, Oral, TID, Ostergard, Clovis Puhomas A, MD, 4 mg at 06/19/18 1643  Physical Exam: AOx3, PERRL, EOMI, FS, TM, Strength 5/5 x4, SILTx4, hoffman's resolved Incision c/d/i  Assessment & Plan: 57 y.o.yo female POD1 s/p  C4-5/5-6/6-7 ACDF, recovering well. -discharge home today  Jadene Pierinihomas A Ostergard  06/14/18 5:08 PM

## 2018-06-20 NOTE — Progress Notes (Signed)
Patient is discharged from room 3C07 at this time. Alert and in stable condition. IV site d/c'd and instructions read to patient and family with understanding verbalized. Left unit via wheelchair with all belongings at side.  

## 2018-06-20 NOTE — Discharge Summary (Signed)
Physician Discharge Summary  Patient ID: Kristen [name] MRN: [id number] DOB/AGE: July 17, 1961 57 y.o.  Admit date: 06/19/2018 Discharge date: 06/20/2018  Admission Diagnoses:  Discharge Diagnoses:  Active Problems:   Foraminal stenosis of cervical region   Discharged Condition: good  Hospital Course: Patient was admitted overnight following an uncomplicated C4-5/5-6/6-7 ACDF. She was was observed overnight in the spine center and recovered well. Her pre-operative bilateral radicular pain was completely resolved. She was discharged home POD1 without issue.  Consults: None  Discharge Exam: Blood pressure (!) 146/84, pulse 81, temperature 98.2 F (36.8 C), temperature source Oral, resp. rate 20, height 5' (1.524 m), weight 67.6 kg, SpO2 92 %. AOx3, PERRL, EOMI, FS, TM, Strength 5/5 x4, SILTx4, hoffman's resolved, incision c/d/i   Disposition:    Allergies as of 06/20/2018      Reactions   Ultram [tramadol] Nausea Only      Medication List    STOP taking these medications   HYDROcodone-acetaminophen 5-325 MG tablet Commonly known as:  NORCO/VICODIN   methylPREDNISolone 4 MG Tbpk tablet Commonly known as:  MEDROL DOSEPAK     TAKE these medications   amitriptyline 25 MG tablet Commonly known as:  ELAVIL Take 50 mg by mouth at bedtime.   aspirin EC 81 MG tablet Take 81 mg by mouth every morning.   celecoxib 200 MG capsule Commonly known as:  CELEBREX Take 200 mg by mouth daily.   cetirizine 10 MG tablet Commonly known as:  ZYRTEC Take 10 mg by mouth daily.   menthol-cetylpyridinium 3 MG lozenge Commonly known as:  CEPACOL Take 1 lozenge (3 mg total) by mouth as needed for sore throat.   multivitamin with minerals Tabs tablet Take 1 tablet by mouth daily.   nystatin cream Commonly known as:  MYCOSTATIN Apply 1 application topically daily as needed for dry skin (feet).   oxyCODONE 5 MG immediate release tablet Commonly known as:  Oxy IR/ROXICODONE Take 1  tablet (5 mg total) by mouth every 4 (four) hours as needed for severe pain.   polyethylene glycol packet Commonly known as:  MIRALAX / GLYCOLAX Take 17 g by mouth daily. What changed:    when to take this  reasons to take this   pregabalin 75 MG capsule Commonly known as:  LYRICA Take 75 mg by mouth daily.   simvastatin 20 MG tablet Commonly known as:  ZOCOR Take 20 mg by mouth every evening.   tiZANidine 4 MG tablet Commonly known as:  ZANAFLEX Take 4 mg by mouth 3 (three) times daily.        Signed: Jadene Pierinihomas A Gianah Batt 06/20/2018, 9:06 AM

## 2018-06-20 NOTE — Care Management CC44 (Deleted)
Condition Code 44 Documentation Completed  Patient Details  Name: Craig Staggersamara R Antrim MRN: [id number] Date of Birth: 10/10/1961   Condition Code 44 given:  Yes Patient signature on Condition Code 44 notice:  Yes Documentation of 2 MD's agreement:  Yes Code 44 added to claim:  Yes    Durenda GuthrieBrady, Jace Fermin Naomi, RN 06/20/2018, 11:02 AM

## 2018-06-20 NOTE — Discharge Instructions (Signed)
Wound Care  You may remove outer bandage after 2 days   Keep incision open to air. Do not put any creams, lotions, or ointments on incision.  Activity Walk each and every day, increasing distance each day. No lifting greater than 5 lbs.  Avoid excessive neck motion. No driving for 2 weeks; may ride as a passenger locally. Wear neck brace at all times except when showering. Diet Resume your normal diet.  Return to Work Will be discussed at you follow up appointment. Call Your Doctor If Any of These Occur Redness, drainage, or swelling at the wound.  Temperature greater than 101 degrees. Severe pain not relieved by pain medication. Increased difficulty swallowing.  Incision starts to come apart. Follow Up Appt Call today and ask for appointment in 1-2 weeks [phone] or for problems.  If you have any hardware placed in your spine, you will need an x-ray before your appointment.

## 2018-06-20 NOTE — Care Management CC44 (Signed)
Condition Code 44 Documentation Completed  Patient Details  Name: Kristen [name] MRN: [id number] Date of Birth: 1961/03/12   Condition Code 44 given:  (No Patient discharged prior to CM giving letter) Patient signature on Condition Code 10944 notice:  (patient did not sign) Documentation of 2 MD's agreement:  Yes Code 44 added to claim:  (patient discharged prior to receiving)    Durenda GuthrieBrady, Damain Broadus Naomi, RN 06/20/2018, 11:14 AM

## 2018-06-22 ENCOUNTER — Encounter (HOSPITAL_COMMUNITY): Payer: Self-pay | Admitting: Neurological Surgery

## 2018-07-10 NOTE — Op Note (Signed)
Operative Report Addendum  PATIENT:Kristen [name]  DATE:06/19/18  PRE-OPERATIVE DIAGNOSIS: Bilateral cervical foraminal stenosis C4-5, C5-6, C6-7  POST-OPERATIVE DIAGNOSIS: Bilateral cervical foraminal stenosis C4-5, C5-6, C6-7  PROCEDURE:C4-5, C5-6, C6-7 Anterior Cervical Discectomy and Instrumented Fusion  SURGEON: Surgeon(s) and Role: Jadene Pierini, MD - Primary Dr. Altamease Oiler, MD - Assisting  ANESTHESIA:ETGA  BRIEF HISTORY: This is a 57yo woman who presented with bilateral radicular pain that failed conservative management. This was discussed with the patient as well as risks, benefits, and alternatives and female wished to proceed with surgical evacuation.  OPERATIVE DETAIL: The patient was taken to the operating room and placed on the OR table in the supine position. A formal time out was performed with two patient identifiers and confirmed the operative site. Anesthesia was induced by the anesthesia team. The operative site was marked, the area was then prepped and draped in a sterile fashion. A transverse linear incision was placed in a crease in the right side of anterior neck. A standard anterior cervical dissection was performed to expose the cervical spine. Longus colli was mobilized laterally and retractors were placed. The operative levels were confirmed via fluoroscopy and a discectomy was performed at C5-C6. The discectomy consisted of complete disc removal, osteophyte removal, and complete bilateral foraminotomies. This was then repeated at C4-C5 and C6-C7. Interbody cortical grafts were then trialed and placed followed by positioning of a dynamic anterior cervical plate ([employer], [employer]). Demineralized bone matrix allograft putty ([place], [employer]) and local autograft was used to fill the center of the cortical graft as well as the remainder of the intervertebral endplates to promote fusion. Bilateral 74mm screws were then placed  into C4, C5, C6, and C7 to secure the plate. Pullout strength and bone quality appeared to be good, the locking screws were then turned and tightened to prevent screw pull-out. Fluoroscopy confirmed that the hardware was in good position. Hemostasis was obtained and the incision was closed in layers in the usual fashion. All instrument and sponge counts were correct, the incision was then closed in layers. The patient was then returned to anesthesia for emergence. No complications were apparent at the completion of the procedure.  EBL:53mL  DRAINS:None  SPECIMENS: none  Jadene Pierini, MD

## 2018-07-10 NOTE — H&P (Signed)
Surgical H&P Update  HPI: 57yo woman w/ bilateral radicular pain, here for surgical treatment. Pain is in the bilateral upper extremities, radicular in nature, moderate to severe in intensity, not responsive to non-surgical measures. No changes in health since she was last seen in clinic.  PMHx: No significant PMHx aside from cervical and lumbar radiculopathy, takes no medications at home PSHx: tubal ligation SocHx: former smoker, quit in 2013 FamHx: CHF in both parents  ROS: 14 point ROS negative except for radicular pain down both arms and symptoms in HPI  Physical Exam: AOx3, PERRL, FS, TM  Strength 5/5 x4, SILTx4  Assesment/Plan: 57 y.o. woman with bilateral cervical radiculopathy, MRI confirms severe foraminal stenosis, here for surgical treatment. Risks, benefits, and alternatives discussed and the patient would like to continue with surgery.  Jadene Pierini, MD

## 2018-07-31 ENCOUNTER — Ambulatory Visit (INDEPENDENT_AMBULATORY_CARE_PROVIDER_SITE_OTHER): Payer: Medicare Other | Admitting: Urology

## 2018-07-31 DIAGNOSIS — N200 Calculus of kidney: Secondary | ICD-10-CM

## 2018-10-01 ENCOUNTER — Other Ambulatory Visit: Payer: Medicare Other

## 2019-01-15 ENCOUNTER — Ambulatory Visit: Payer: Self-pay | Admitting: Urology

## 2019-04-25 ENCOUNTER — Other Ambulatory Visit (HOSPITAL_COMMUNITY): Payer: Self-pay | Admitting: Internal Medicine

## 2019-04-25 DIAGNOSIS — Z1231 Encounter for screening mammogram for malignant neoplasm of breast: Secondary | ICD-10-CM

## 2019-06-02 IMAGING — DX DG ABDOMEN 1V
1 series · 1 of 1 positions shown · non-contrast
Comparison: KUB May 09, 2017

CLINICAL DATA: Follow-up known large right-sided kidney stone. No
current complaints.

EXAM:
ABDOMEN - 1 VIEW

[abdomen kub]
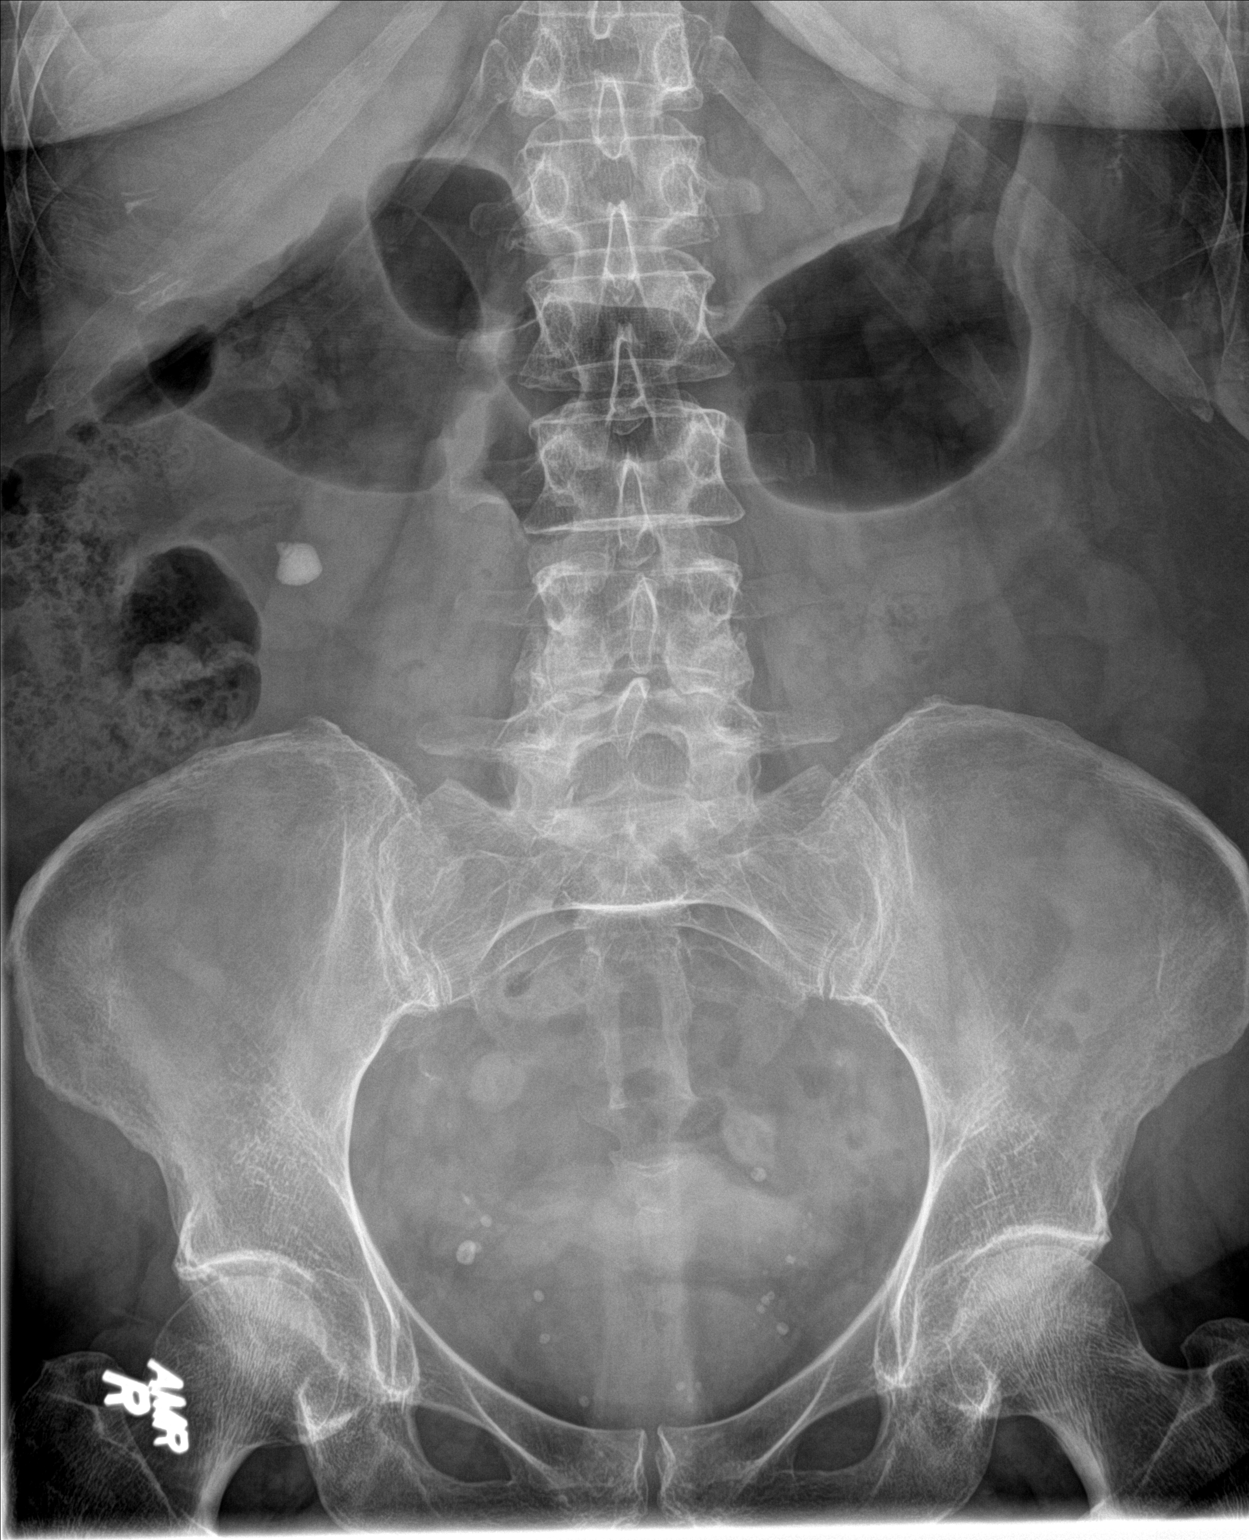

[1 of 1 positions shown; findings below may reference images not displayed]

FINDINGS: The large lower pole kidney stone is again demonstrated. It measures
12 mm in diameter and is slightly larger than on the previous study.
No other kidney stones are observed. There are no ureteral or
bladder stones. There are numerous pelvic phleboliths. The bowel gas
pattern is normal. The bony structures exhibit no acute
abnormalities.
IMPRESSION: Slight interval increase in the size of the lower pole kidney stone
on the right. No stones are observed elsewhere.

## 2019-06-10 IMAGING — RF DG CERVICAL SPINE 1V
1 series · 1 of 1 positions shown · non-contrast
Comparison: MRI June 06, 2018.

CLINICAL DATA: Anterior cervical fusion of C4-5, C5-6 and C6-7.

EXAM:
DG C-ARM 61-120 MIN; DG CERVICAL SPINE - 1 VIEW
FLUOROSCOPY TIME:  8 seconds.

[Series 1: run · 1 of 1 slices shown]
[im 1/1]
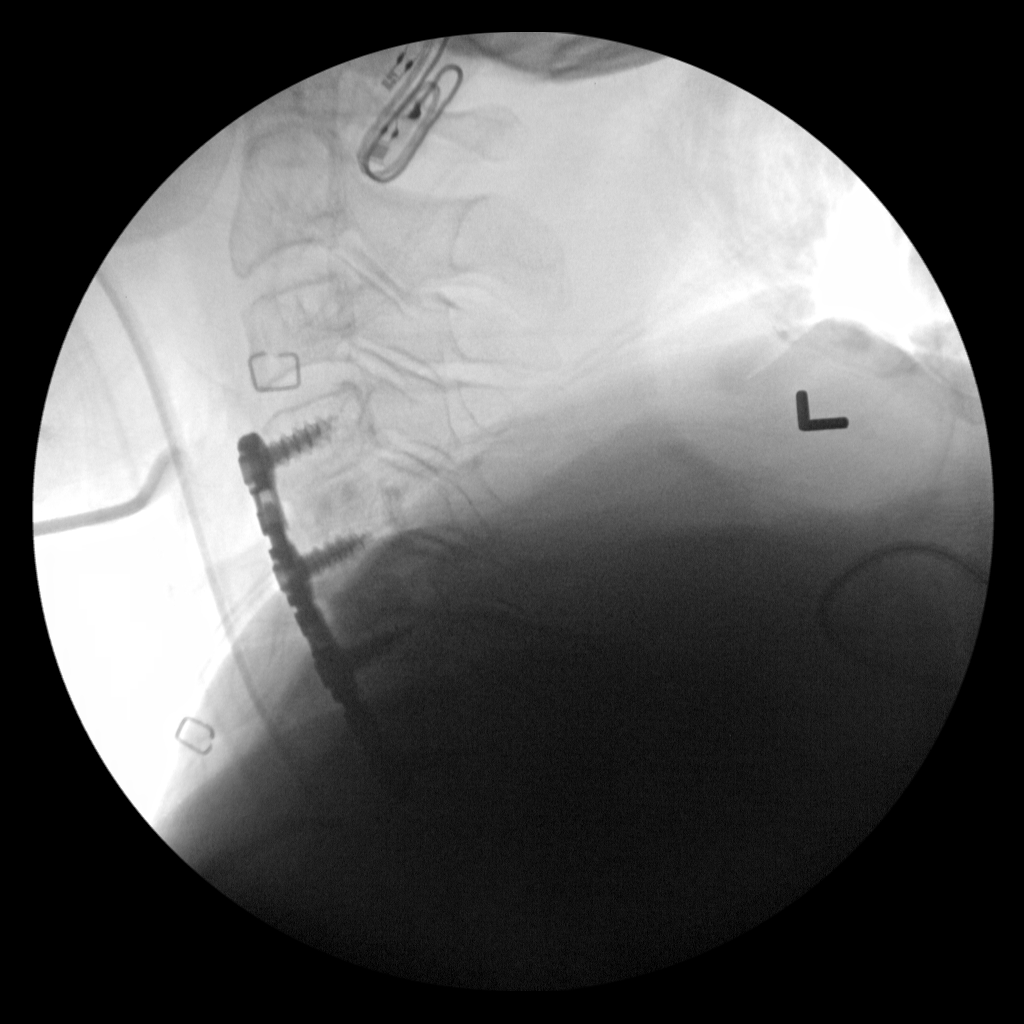

[1 of 1 positions shown; findings below may reference images not displayed]

FINDINGS: Status post surgical anterior fusion of C4-5, C5-6 and C6-7 with
intervening spacers. Good alignment of vertebral body is noted.
IMPRESSION: Status post surgical anterior fusion of C4-5, C5-6 and C6-7.

## 2019-08-01 ENCOUNTER — Emergency Department (HOSPITAL_COMMUNITY)
Admission: EM | Admit: 2019-08-01 | Discharge: 2019-08-01 | Disposition: A | Discharge status: Home / Self Care | Payer: Medicare Other | Attending: Emergency Medicine | Admitting: Emergency Medicine

## 2019-08-01 ENCOUNTER — Other Ambulatory Visit: Payer: Self-pay

## 2019-08-01 ENCOUNTER — Encounter (HOSPITAL_COMMUNITY): Payer: Self-pay | Admitting: Emergency Medicine

## 2019-08-01 DIAGNOSIS — H6692 Otitis media, unspecified, left ear: Secondary | ICD-10-CM | POA: Diagnosis not present

## 2019-08-01 DIAGNOSIS — Z7982 Long term (current) use of aspirin: Secondary | ICD-10-CM | POA: Diagnosis not present

## 2019-08-01 DIAGNOSIS — H6690 Otitis media, unspecified, unspecified ear: Secondary | ICD-10-CM

## 2019-08-01 DIAGNOSIS — F17210 Nicotine dependence, cigarettes, uncomplicated: Secondary | ICD-10-CM | POA: Insufficient documentation

## 2019-08-01 DIAGNOSIS — Z79899 Other long term (current) drug therapy: Secondary | ICD-10-CM | POA: Insufficient documentation

## 2019-08-01 DIAGNOSIS — H9202 Otalgia, left ear: Secondary | ICD-10-CM | POA: Diagnosis present

## 2019-08-01 MED ORDER — AMOXICILLIN 500 MG PO CAPS: 500.0000 mg | ORAL_CAPSULE | Freq: Three times a day (TID) | ORAL | 0 refills | Status: DC

## 2019-08-01 NOTE — ED Provider Notes (Signed)
[facility]ERGENCY DEPARTMENT Provider Note   CSN: [id number] Arrival date & time: 08/01/19  1044     History   Chief Complaint Chief Complaint  Patient presents with  . Otalgia    HPI Kristen [name] is a 58 y.o. female.     HPI   Kristen [name] is a 58 y.o. female who presents to the Emergency Department complaining of nasal congestion and left ear pain for 1 week.  She is states that she has seasonal allergies and takes Zyrtec.  She noticed increased nasal congestion and left-sided pressure and pain along her left ear.  She describes the pain as throbbing and also noticed a "knot" just below her left ear.  She reports left-sided facial swelling yesterday that has resolved today.  She denies sore throat, cough, chest pain, fever or chills, and shortness of breath.  No known COVID exposures.  No loss of taste or smell.    Past Medical History:  Diagnosis Date  . Arthritis   . Bone spur   . Cervical radiculopathy   . Chronic neck and back pain   . Depression   . Headache    due to neck pain  . Herniated disc, cervical   . History of kidney stones   . Kidney cyst, acquired   . Low iron    as a child  . Lumbar radiculopathy   . Pinched nerve   . Pneumonia    as a child  . Pre-diabetes   . PTSD (post-traumatic stress disorder)   . PTSD (post-traumatic stress disorder)   . Stroke [facility](HCC)    Mini stroke - 2005 caused by stress per pt.    Patient Active Problem List   Diagnosis Date Noted  . Cervical radiculopathy 06/20/2018  . Foraminal stenosis of cervical region 06/19/2018    Past Surgical History:  Procedure Laterality Date  . ANTERIOR CERVICAL DECOMP/DISCECTOMY FUSION N/A 06/19/2018   Procedure: Cervical 4-5 Cervical 5-6 Cervical 6-7 Anterior cervical decompression/discectomy/fusion/right side approach;  Surgeon: Jadene Pierinistergard, Thomas A, MD;  [facility];  Service: Neurosurgery;  Laterality: N/A;  Cervical 4-5 Cervical 5-6 Cervical 6-7 Anterior  cervical decompression/discectomy/fusion/right side approach  . COLONOSCOPY    . TUBAL LIGATION       OB History    Gravida  2   Para  2   Term  1   Preterm  1   AB      Living  2     SAB      TAB      Ectopic      Multiple      Live Births               Home Medications    Prior to Admission medications   Medication Sig Start Date End Date Taking? Authorizing Provider  amitriptyline (ELAVIL) 25 MG tablet Take 50 mg by mouth at bedtime. 04/04/18   [provider]  aspirin EC 81 MG tablet Take 81 mg by mouth every morning.    [provider]  celecoxib (CELEBREX) 200 MG capsule Take 200 mg by mouth daily.     [provider]  cetirizine (ZYRTEC) 10 MG tablet Take 10 mg by mouth daily.    [provider]  menthol-cetylpyridinium (CEPACOL) 3 MG lozenge Take 1 lozenge (3 mg total) by mouth as needed for sore throat. 06/20/18   Jadene Pierinistergard, Thomas A, MD  Multiple Vitamin (MULTIVITAMIN WITH MINERALS) TABS tablet Take 1 tablet by  mouth daily.    [provider]  nystatin cream (MYCOSTATIN) Apply 1 application topically daily as needed for dry skin (feet).    [provider]  oxyCODONE (OXY IR/ROXICODONE) 5 MG immediate release tablet Take 1 tablet (5 mg total) by mouth every 4 (four) hours as needed for severe pain. 06/20/18   Jadene Pierini, MD  polyethylene glycol (MIRALAX / GLYCOLAX) packet Take 17 g by mouth daily. Patient taking differently: Take 17 g by mouth as needed for mild constipation.  02/21/16   Mesner, Barbara Cower, MD  pregabalin (LYRICA) 75 MG capsule Take 75 mg by mouth daily.    [provider]  simvastatin (ZOCOR) 20 MG tablet Take 20 mg by mouth every evening.  01/06/16   [provider]  tiZANidine (ZANAFLEX) 4 MG tablet Take 4 mg by mouth 3 (three) times daily.     [provider]    Family History Family History  Problem Relation Age of Onset  . Heart failure Mother   .  Heart attack Mother   . Heart failure Father   . Heart attack Father     Social History Social History   Tobacco Use  . Smoking status: Current Every Day Smoker    Packs/day: 0.50    Types: Cigarettes  . Smokeless tobacco: Never Used  . Tobacco comment: 5-7 cigarettes a day  Substance Use Topics  . Alcohol use: No  . Drug use: No     Allergies   Ultram [tramadol]   Review of Systems Review of Systems  Constitutional: Negative for activity change, appetite change, chills and fever.  HENT: Positive for congestion, ear pain, facial swelling and sinus pressure. Negative for dental problem, rhinorrhea, sore throat and trouble swallowing.   Eyes: Negative for visual disturbance.  Respiratory: Negative for cough, chest tightness, shortness of breath, wheezing and stridor.   Cardiovascular: Negative for chest pain.  Gastrointestinal: Negative for abdominal pain, nausea and vomiting.  Musculoskeletal: Negative for neck pain and neck stiffness.  Skin: Negative for rash.  Neurological: Negative for dizziness, weakness, numbness and headaches.  Hematological: Negative for adenopathy.  Psychiatric/Behavioral: Negative for confusion.     Physical Exam Updated Vital Signs BP (!) 153/99   Pulse 86   Temp 98.4 F (36.9 C) (Oral)   Resp 17   SpO2 97%   Physical Exam Vitals signs and nursing note reviewed.  Constitutional:      Appearance: Normal appearance. She is not ill-appearing or toxic-appearing.  HENT:     Head: Atraumatic.     Jaw: There is normal jaw occlusion. No tenderness, swelling or pain on movement.     Comments: No facial edema noted    Right Ear: Tympanic membrane and ear canal normal.     Left Ear: Ear canal normal. No decreased hearing noted. No swelling.  No middle ear effusion. No mastoid tenderness. Tympanic membrane is erythematous. Tympanic membrane is not bulging.     Mouth/Throat:     Mouth: Mucous membranes are moist.     Pharynx: Oropharynx is  clear. Uvula midline. No oropharyngeal exudate or posterior oropharyngeal erythema.     Tonsils: No tonsillar abscesses.     Comments: Uvula is midline and nonedematous.  No tonsillar exudates no peritonsillar abscess. Neck:     Musculoskeletal: Normal range of motion. No neck rigidity or muscular tenderness.  Cardiovascular:     Rate and Rhythm: Normal rate and regular rhythm.     Pulses: Normal pulses.  Pulmonary:  Effort: Pulmonary effort is normal.     Breath sounds: Normal breath sounds. No wheezing.  Chest:     Chest wall: No tenderness.  Musculoskeletal: Normal range of motion.  Lymphadenopathy:     Cervical: No cervical adenopathy.  Skin:    General: Skin is warm.     Capillary Refill: Capillary refill takes less than 2 seconds.     Findings: No erythema or rash.  Neurological:     General: No focal deficit present.     Mental Status: She is alert.     Sensory: No sensory deficit.     Motor: No weakness.      ED Treatments / Results  Labs (all labs ordered are listed, but only abnormal results are displayed) Labs Reviewed - No data to display  EKG None  Radiology No results found.  Procedures Procedures (including critical care time)  Medications Ordered in ED Medications - No data to display   Initial Impression / Assessment and Plan / ED Course  I have reviewed the triage vital signs and the nursing notes.  Pertinent labs & imaging results that were available during my care of the patient were reviewed by me and considered in my medical decision making (see chart for details).        Patient is well-appearing, nontoxic.  Patient symptoms are likely left-sided otitis media.  No appreciable facial edema, dental pain, or erythema of the face.  Will treat with amoxicillin, patient will continue her Zyrtec, Tylenol if needed for pain.  Return precautions discussed.  Final Clinical Impressions(s) / ED Diagnoses   Final diagnoses:  Acute otitis  media, unspecified otitis media type    ED Discharge Orders    None       Bufford Lope 08/01/19 1213    Noemi Chapel, MD 08/02/19 [phone]

## 2019-08-01 NOTE — ED Triage Notes (Signed)
Pt c/o bilateral ear pain and "allergies" x 1 -2 weeks. Nad.

## 2019-08-01 NOTE — Discharge Instructions (Addendum)
Take the antibiotic as directed until its finished.  You may take Tylenol if needed for pain or fever.  Follow-up with your primary doctor for recheck if needed.

## 2019-08-07 ENCOUNTER — Inpatient Hospital Stay (HOSPITAL_COMMUNITY): Admission: RE | Admit: 2019-08-07 | Payer: Medicare Other | Source: Ambulatory Visit

## 2019-08-09 ENCOUNTER — Ambulatory Visit (HOSPITAL_COMMUNITY)
Admission: RE | Admit: 2019-08-09 | Discharge: 2019-08-09 | Disposition: A | Discharge status: Home / Self Care | Payer: Medicare Other | Source: Ambulatory Visit | Attending: Urology | Admitting: Urology

## 2019-08-09 ENCOUNTER — Other Ambulatory Visit: Payer: Self-pay

## 2019-08-09 ENCOUNTER — Other Ambulatory Visit (HOSPITAL_COMMUNITY): Payer: Self-pay | Admitting: Urology

## 2019-08-09 DIAGNOSIS — N200 Calculus of kidney: Secondary | ICD-10-CM | POA: Insufficient documentation

## 2019-08-13 ENCOUNTER — Ambulatory Visit (INDEPENDENT_AMBULATORY_CARE_PROVIDER_SITE_OTHER): Payer: Medicare Other | Admitting: Urology

## 2019-08-13 DIAGNOSIS — N200 Calculus of kidney: Secondary | ICD-10-CM

## 2019-09-18 ENCOUNTER — Other Ambulatory Visit: Payer: Self-pay

## 2019-09-18 ENCOUNTER — Ambulatory Visit (HOSPITAL_COMMUNITY)
Admission: RE | Admit: 2019-09-18 | Discharge: 2019-09-18 | Disposition: A | Discharge status: Home / Self Care | Payer: Medicare Other | Source: Ambulatory Visit | Attending: Internal Medicine | Admitting: Internal Medicine

## 2019-09-18 DIAGNOSIS — Z1231 Encounter for screening mammogram for malignant neoplasm of breast: Secondary | ICD-10-CM | POA: Diagnosis not present

## 2019-11-21 ENCOUNTER — Other Ambulatory Visit: Payer: Self-pay

## 2019-11-21 DIAGNOSIS — N200 Calculus of kidney: Secondary | ICD-10-CM

## 2020-07-30 IMAGING — DX DG ABDOMEN 1V
1 series · 1 of 1 positions shown · non-contrast
Comparison: Abdominal radiograph 06/11/2018

CLINICAL DATA: Calculus renal x9yrs

EXAM:
ABDOMEN - 1 VIEW

[abdomen kub]
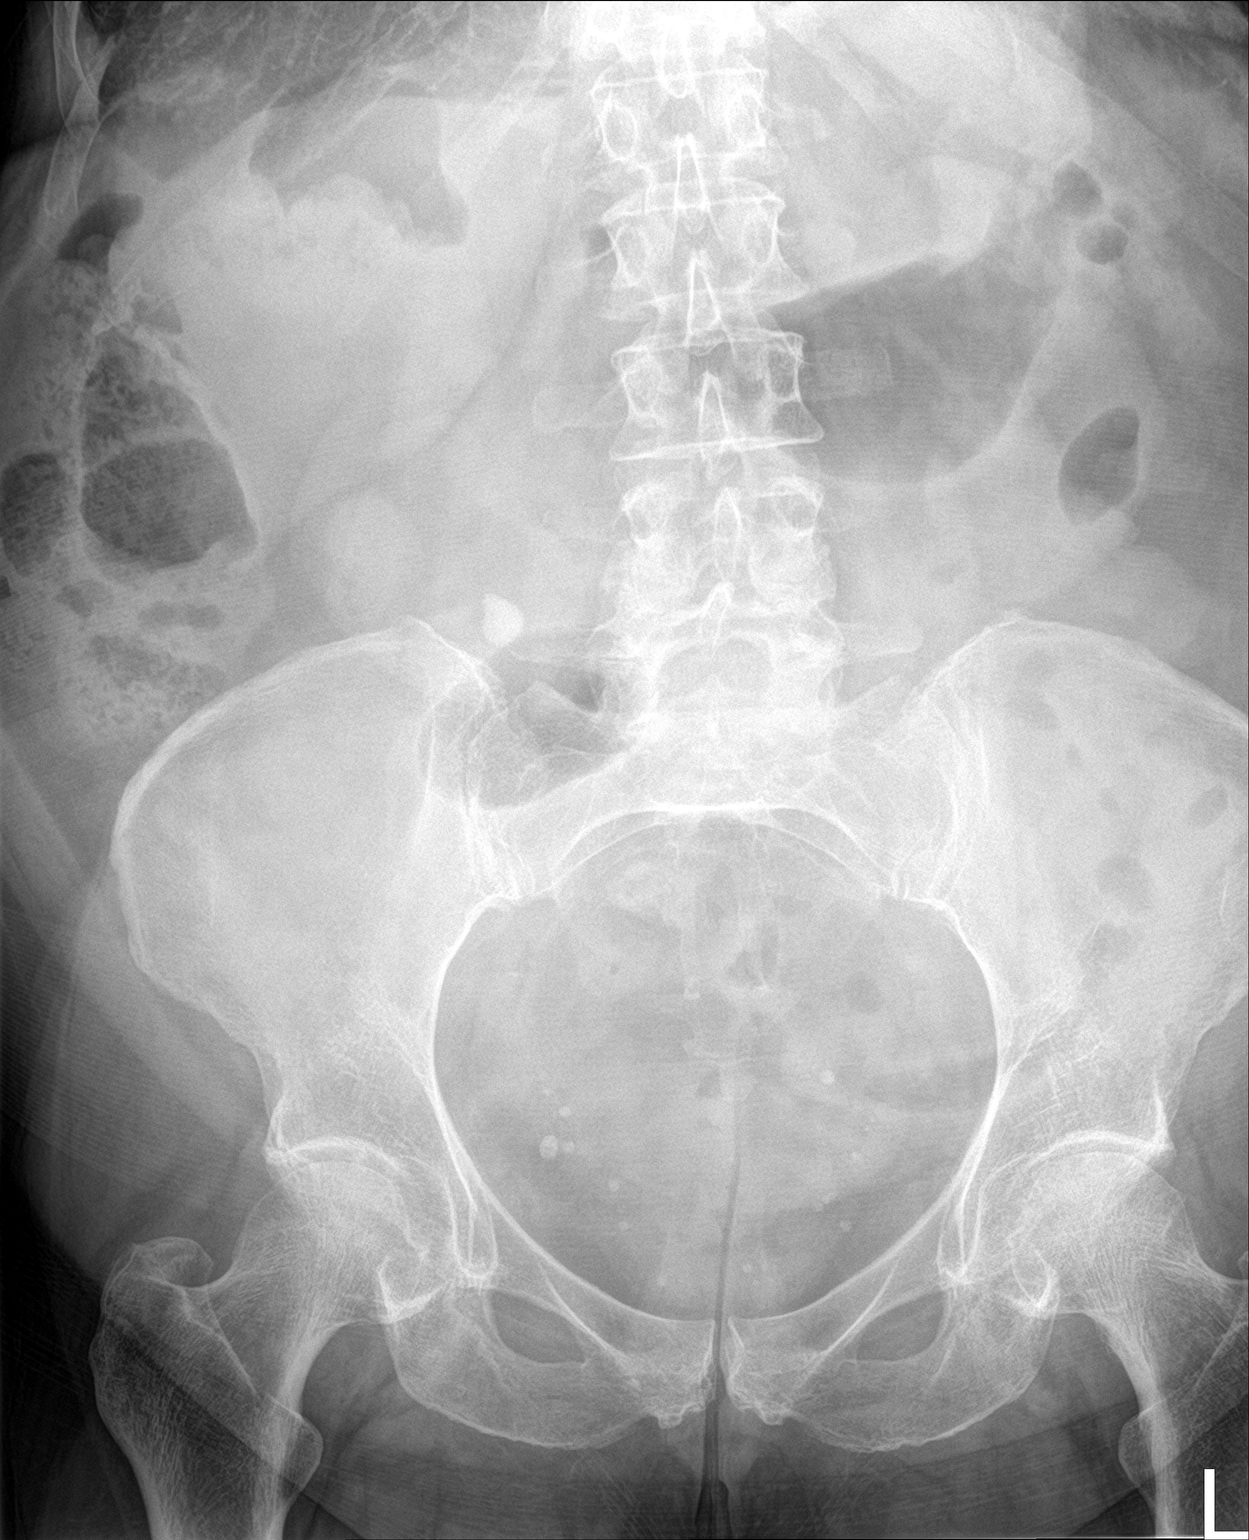

[1 of 1 positions shown; findings below may reference images not displayed]

FINDINGS: Redemonstrated radiopaque density projecting over the inferior pole
the right kidney measuring 12 mm, similar to prior. No other calculi
are identified. There are scattered phleboliths in the pelvis.
Nonobstructive bowel gas pattern. No acute findings.
IMPRESSION: Redemonstrated 12 mm calculus in the inferior pole the right kidney.
No new findings.

## 2020-09-16 ENCOUNTER — Other Ambulatory Visit (HOSPITAL_COMMUNITY): Payer: Self-pay | Admitting: Internal Medicine

## 2020-09-16 DIAGNOSIS — Z1231 Encounter for screening mammogram for malignant neoplasm of breast: Secondary | ICD-10-CM

## 2020-10-07 ENCOUNTER — Ambulatory Visit (HOSPITAL_COMMUNITY)
Admission: RE | Admit: 2020-10-07 | Discharge: 2020-10-07 | Disposition: A | Discharge status: Home / Self Care | Payer: Medicare Other | Source: Ambulatory Visit | Attending: Internal Medicine | Admitting: Internal Medicine

## 2020-10-07 ENCOUNTER — Other Ambulatory Visit: Payer: Self-pay

## 2020-10-07 DIAGNOSIS — Z1231 Encounter for screening mammogram for malignant neoplasm of breast: Secondary | ICD-10-CM | POA: Insufficient documentation

## 2021-09-01 ENCOUNTER — Other Ambulatory Visit (HOSPITAL_COMMUNITY): Payer: Self-pay | Admitting: Internal Medicine

## 2021-09-23 ENCOUNTER — Ambulatory Visit (INDEPENDENT_AMBULATORY_CARE_PROVIDER_SITE_OTHER): Payer: Medicare Other | Admitting: Adult Health

## 2021-09-23 ENCOUNTER — Other Ambulatory Visit: Payer: Self-pay

## 2021-09-23 ENCOUNTER — Other Ambulatory Visit (HOSPITAL_COMMUNITY)
Admission: RE | Admit: 2021-09-23 | Discharge: 2021-09-23 | Disposition: A | Discharge status: Home / Self Care | Payer: Medicare Other | Source: Ambulatory Visit | Attending: Adult Health | Admitting: Adult Health

## 2021-09-23 ENCOUNTER — Encounter: Payer: Self-pay | Admitting: Adult Health

## 2021-09-23 VITALS — BP 121/79 | HR 81 | Ht 60.5 in | Wt 163.6 lb

## 2021-09-23 DIAGNOSIS — Z01419 Encounter for gynecological examination (general) (routine) without abnormal findings: Secondary | ICD-10-CM | POA: Diagnosis present

## 2021-09-23 DIAGNOSIS — Z780 Asymptomatic menopausal state: Secondary | ICD-10-CM | POA: Diagnosis not present

## 2021-09-23 DIAGNOSIS — Z1211 Encounter for screening for malignant neoplasm of colon: Secondary | ICD-10-CM

## 2021-09-23 DIAGNOSIS — Z1151 Encounter for screening for human papillomavirus (HPV): Secondary | ICD-10-CM | POA: Diagnosis not present

## 2021-09-23 LAB — HEMOCCULT GUIAC POC 1CARD (OFFICE): Fecal Occult Blood, POC: NEGATIVE

## 2021-09-23 NOTE — Progress Notes (Signed)
Patient ID: Kristen [name], female   DOB: 1961-06-17, 60 y.o.   MRN: [id number] History of Present Illness: Kristen [name] is a 60 year old white female, divorced, PM in for pelvic and pap. She lives with her brother. She had physical with PCP 08/01/21.  She is a new pt.   Current Medications, Allergies, Past Medical History, Past Surgical History, Family History and Social History were reviewed in [employer] record.     Review of Systems: Patient denies any headaches, hearing loss, fatigue, blurred vision, shortness of breath, chest pain, abdominal pain, problems with bowel movements, urination, or intercourse.(No sex in over 6 years). No mood swings. Has joint pain and body aches, has OA on lyrica. Denies any vaginal bleeding.    Physical Exam:BP 121/79 (BP Location: Left Arm, Patient Position: Sitting, Cuff Size: Normal)   Pulse 81   Ht 5' 0.5" (1.537 m)   Wt 163 lb 9.6 oz (74.2 kg)   BMI 31.43 kg/m   General:  Well developed, well nourished, no acute distress Skin:  Warm and dry Neck:  Midline trachea, normal thyroid, good ROM, no lymphadenopathy,no carotid bruits heard Lungs; Clear to auscultation bilaterally Breast:  No dominant palpable mass, retraction, or nipple discharge Cardiovascular: Regular rate and rhythm Abdomen:  Soft, non tender, no hepatosplenomegaly Pelvic:  External genitalia is normal in appearance, no lesions.  The vagina is pale with loss of rugae. Urethra has no lesions or masses. The cervix is smooth, pap with HR HPV genotyping performed.  Uterus is felt to be normal size, shape, and contour.  No adnexal masses or tenderness noted.Bladder is non tender, no masses felt. Rectal: Good sphincter tone, no polyps, or hemorrhoids felt.  Hemoccult negative. Extremities/musculoskeletal:  No swelling or varicosities noted, no clubbing or cyanosis Psych:  No mood changes, alert and cooperative,seems happy AA is 0  Fall risk is low Depression screen  PHQ 2/9 09/23/2021  Decreased Interest 0  Down, Depressed, Hopeless 0  PHQ - 2 Score 0  Altered sleeping 0  Tired, decreased energy 0  Change in appetite 1  Feeling bad or failure about yourself  0  Trouble concentrating 0  Moving slowly or fidgety/restless 0  Suicidal thoughts 0  PHQ-9 Score 1   On meds GAD 7 : Generalized Anxiety Score 09/23/2021  Nervous, Anxious, on Edge 0  Control/stop worrying 0  Worry too much - different things 0  Trouble relaxing 0  Restless 0  Easily annoyed or irritable 0  Afraid - awful might happen 0  Total GAD 7 Score 0      Upstream - 09/23/21 1035       Pregnancy Intention Screening   Does the patient want to become pregnant in the next year? N/A    Does the patient's partner want to become pregnant in the next year? N/A    Would the patient like to discuss contraceptive options today? N/A      Contraception Wrap Up   Current Method Abstinence;Female Sterilization   post menopause   End Method Abstinence;Female Sterilization   post menopause   Contraception Counseling Provided No               Examination chaperoned by   Impression and Plan: 1. Encounter for gynecological examination with Papanicolaou smear of cervix Pap sent  Physical with PCP Pap in 3 years if normal Labs with PCP Colonoscopy per GI she said had normal one 2018, good for 10 years she says  Mammogram  in December She says she will try to decrease smoking  2. Encounter for screening fecal occult blood testing   3. Postmenopause

## 2021-10-04 LAB — CYTOLOGY - PAP
Comment: NEGATIVE
Diagnosis: NEGATIVE
High risk HPV: NEGATIVE

## 2021-10-11 ENCOUNTER — Other Ambulatory Visit (HOSPITAL_COMMUNITY): Payer: Self-pay | Admitting: Internal Medicine

## 2021-10-11 DIAGNOSIS — Z1231 Encounter for screening mammogram for malignant neoplasm of breast: Secondary | ICD-10-CM

## 2021-10-27 ENCOUNTER — Ambulatory Visit (HOSPITAL_COMMUNITY)
Admission: RE | Admit: 2021-10-27 | Discharge: 2021-10-27 | Disposition: A | Discharge status: Home / Self Care | Payer: Medicare Other | Source: Ambulatory Visit | Attending: Internal Medicine | Admitting: Internal Medicine

## 2021-10-27 ENCOUNTER — Other Ambulatory Visit: Payer: Self-pay

## 2021-10-27 DIAGNOSIS — Z1231 Encounter for screening mammogram for malignant neoplasm of breast: Secondary | ICD-10-CM | POA: Diagnosis not present

## 2021-11-01 NOTE — Progress Notes (Signed)
History of Present Illness: Kristen [name] is a 60 y.o. year old female here for followup of a RLP stone.  At her last visit just over 2 years ago I wanted to see her back in a couple of years to follow-up a 13 mm stone.  Since that time she has had no symptoms of note-hematuria, flank pain, urinary tract infections.  She still smokes.  Her older sister died at the age of 31 from a heart attack.  Past Medical History:  Diagnosis Date   Arthritis    Bone spur    Cervical radiculopathy    Chronic neck and back pain    Depression    Headache    due to neck pain   Herniated disc, cervical    History of kidney stones    Kidney cyst, acquired    Low iron    as a child   Lumbar radiculopathy    Pinched nerve    Pneumonia    as a child   Pre-diabetes    PTSD (post-traumatic stress disorder)    PTSD (post-traumatic stress disorder)    Stroke (HCC)    Mini stroke - 2005 caused by stress per pt.    Past Surgical History:  Procedure Laterality Date   ANTERIOR CERVICAL DECOMP/DISCECTOMY FUSION N/A 06/19/2018   Procedure: Cervical 4-5 Cervical 5-6 Cervical 6-7 Anterior cervical decompression/discectomy/fusion/right side approach;  Surgeon: Jadene Pierini, MD;  Location: [facility] OR;  Service: Neurosurgery;  Laterality: N/A;  Cervical 4-5 Cervical 5-6 Cervical 6-7 Anterior cervical decompression/discectomy/fusion/right side approach   COLONOSCOPY     TUBAL LIGATION      Home Medications:  (Not in a hospital admission)   Allergies:  Allergies  Allergen Reactions   Ultram [Tramadol] Nausea Only    Family History  Problem Relation Age of Onset   Diabetes Maternal Grandmother    Congestive Heart Failure Maternal Grandfather    Heart failure Father    Heart attack Father    Heart failure Mother    Heart attack Mother    Hypertension Brother    Hypertension Sister    Kidney Stones Sister     Social History:  reports that she has been smoking cigarettes. She has been smoking an  average of .5 packs per day. She has never used smokeless tobacco. She reports that she does not drink alcohol and does not use drugs.  ROS: A complete review of systems was performed.  All systems are negative except for pertinent findings as noted.  Physical Exam:  Vital signs in last 24 hours: @VSRANGES @ General:  Alert and oriented, No acute distress HEENT: Normocephalic, atraumatic Neck: No JVD or lymphadenopathy Cardiovascular: Regular rate  Lungs: Normal inspiratory/expiratory excursion Extremities: No edema Neurologic: Grossly intact  I have reviewed prior pt notes  I have reviewed urinalysis results  I have independently reviewed prior imaging--KUB   Impression/Assessment:  Right lower pole calculus, 13 mm in size 2 years ago.  Currently asymptomatic.  She has not had recent imaging  Plan:  1.  I will get a KUB today-no treatment necessary currently unless she has significant growth  2.  If stable, I will see back in 2 years  3.  Stone prevention diet discussed, stop smoking advice given  11/01/2021, 5:15 PM  11/03/2021. [name] MD

## 2021-11-02 ENCOUNTER — Other Ambulatory Visit: Payer: Self-pay

## 2021-11-02 ENCOUNTER — Ambulatory Visit (INDEPENDENT_AMBULATORY_CARE_PROVIDER_SITE_OTHER): Payer: Medicare Other | Admitting: Urology

## 2021-11-02 ENCOUNTER — Encounter: Payer: Self-pay | Admitting: Urology

## 2021-11-02 ENCOUNTER — Ambulatory Visit (HOSPITAL_COMMUNITY)
Admission: RE | Admit: 2021-11-02 | Discharge: 2021-11-02 | Disposition: A | Discharge status: Home / Self Care | Payer: Medicare Other | Source: Ambulatory Visit | Attending: Urology | Admitting: Urology

## 2021-11-02 VITALS — BP 137/90 | HR 73

## 2021-11-02 DIAGNOSIS — N200 Calculus of kidney: Secondary | ICD-10-CM | POA: Insufficient documentation

## 2021-11-02 LAB — URINALYSIS, ROUTINE W REFLEX MICROSCOPIC
Bilirubin, UA: NEGATIVE
Glucose, UA: NEGATIVE
Ketones, UA: NEGATIVE
Leukocytes,UA: NEGATIVE
Nitrite, UA: NEGATIVE
Protein,UA: NEGATIVE
RBC, UA: NEGATIVE
Specific Gravity, UA: 1.01 (ref 1.005–1.030)
Urobilinogen, Ur: 0.2 mg/dL (ref 0.2–1.0)
pH, UA: 7.5 (ref 5.0–7.5)

## 2021-11-02 NOTE — Progress Notes (Signed)
Urological Symptom Review  Patient is experiencing the following symptoms: Get up at night to urinate   Review of Systems  Gastrointestinal (upper)  : Negative for upper GI symptoms  Gastrointestinal (lower) : Negative for lower GI symptoms  Constitutional : Negative for symptoms  Skin: Negative for skin symptoms  Eyes: Negative for eye symptoms  Ear/Nose/Throat : Negative for Ear/Nose/Throat symptoms  Hematologic/Lymphatic: Negative for Hematologic/Lymphatic symptoms  Cardiovascular : Negative for cardiovascular symptoms  Respiratory : Negative for respiratory symptoms  Endocrine: Negative for endocrine symptoms  Musculoskeletal: Back pain Joint pain  Neurological: Negative for neurological symptoms  Psychologic: Negative for psychiatric symptoms 

## 2021-11-03 ENCOUNTER — Telehealth: Payer: Self-pay

## 2021-11-03 NOTE — Telephone Encounter (Signed)
-----   Message from Marcine Matar, MD sent at 11/02/2021 11:33 AM EST ----- Please call patient-let her know that the stone is exactly the same size as it was 2 years ago, continue every other year follow-up.  She called last week to see if she could have a KUB done before she saw me today.  Unfortunately, that did not get done.

## 2021-11-03 NOTE — Telephone Encounter (Signed)
Patient called and notified of results.  Voiced understanding. 

## 2021-11-09 ENCOUNTER — Telehealth: Payer: Self-pay

## 2021-11-09 NOTE — Telephone Encounter (Signed)
Patient was called and VM was left

## 2021-11-09 NOTE — Telephone Encounter (Signed)
-----   Message from Marcine Matar, MD sent at 11/05/2021  7:15 AM EST ----- Let pt know that stone is in same position ans is same size--continue panned appt in 2 years ----- Message ----- From: Bonnita Levan, CMA Sent: 11/03/2021   8:20 AM EST To: Marcine Matar, MD  Please review

## 2023-10-23 ENCOUNTER — Other Ambulatory Visit: Payer: Self-pay | Admitting: Urology

## 2023-10-23 DIAGNOSIS — N200 Calculus of kidney: Secondary | ICD-10-CM

## 2023-10-24 ENCOUNTER — Ambulatory Visit: Payer: Medicare Other | Admitting: Urology

## 2023-10-31 ENCOUNTER — Ambulatory Visit: Payer: Medicare Other | Admitting: Urology
